# Patient Record
Sex: Male | Born: 1955
Health system: Southern US, Community
[De-identification: ages and names within clinical notes are randomized; demographics above are authoritative.]

## PROBLEM LIST (undated history)

## (undated) DIAGNOSIS — E119 Type 2 diabetes mellitus without complications: Secondary | ICD-10-CM

## (undated) DIAGNOSIS — E785 Hyperlipidemia, unspecified: Secondary | ICD-10-CM

## (undated) DIAGNOSIS — K420 Umbilical hernia with obstruction, without gangrene: Secondary | ICD-10-CM

## (undated) DIAGNOSIS — K219 Gastro-esophageal reflux disease without esophagitis: Secondary | ICD-10-CM

## (undated) DIAGNOSIS — Z973 Presence of spectacles and contact lenses: Secondary | ICD-10-CM

## (undated) DIAGNOSIS — I10 Essential (primary) hypertension: Secondary | ICD-10-CM

## (undated) DIAGNOSIS — Z86010 Personal history of colonic polyps: Secondary | ICD-10-CM

## (undated) DIAGNOSIS — C61 Malignant neoplasm of prostate: Secondary | ICD-10-CM

## (undated) DIAGNOSIS — R351 Nocturia: Secondary | ICD-10-CM

## (undated) DIAGNOSIS — Z860101 Personal history of adenomatous and serrated colon polyps: Secondary | ICD-10-CM

## (undated) DIAGNOSIS — I517 Cardiomegaly: Secondary | ICD-10-CM

## (undated) DIAGNOSIS — R972 Elevated prostate specific antigen [PSA]: Secondary | ICD-10-CM

## (undated) HISTORY — PX: COLONOSCOPY: SHX174

## (undated) HISTORY — PX: POLYPECTOMY: SHX149

## (undated) HISTORY — DX: Hyperlipidemia, unspecified: E78.5

## (undated) HISTORY — PX: FINGER SURGERY: SHX640

## (undated) HISTORY — DX: Cardiomegaly: I51.7

## (undated) HISTORY — DX: Essential (primary) hypertension: I10

## (undated) HISTORY — PX: WISDOM TOOTH EXTRACTION: SHX21

## (undated) HISTORY — PX: OTHER SURGICAL HISTORY: SHX169

## (undated) HISTORY — DX: Gastro-esophageal reflux disease without esophagitis: K21.9

---
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
## 2004-07-19 ENCOUNTER — Ambulatory Visit: Payer: Self-pay | Admitting: Internal Medicine

## 2004-08-02 ENCOUNTER — Ambulatory Visit: Payer: Self-pay | Admitting: Internal Medicine

## 2010-12-15 ENCOUNTER — Encounter (INDEPENDENT_AMBULATORY_CARE_PROVIDER_SITE_OTHER): Payer: Self-pay | Admitting: General Surgery

## 2010-12-20 ENCOUNTER — Ambulatory Visit (INDEPENDENT_AMBULATORY_CARE_PROVIDER_SITE_OTHER): Payer: 59 | Admitting: General Surgery

## 2010-12-20 ENCOUNTER — Encounter (INDEPENDENT_AMBULATORY_CARE_PROVIDER_SITE_OTHER): Payer: Self-pay | Admitting: General Surgery

## 2010-12-20 VITALS — BP 138/94 | HR 64 | Temp 98.4°F | Ht 70.5 in | Wt 175.2 lb

## 2010-12-20 DIAGNOSIS — K436 Other and unspecified ventral hernia with obstruction, without gangrene: Secondary | ICD-10-CM

## 2010-12-20 NOTE — Patient Instructions (Signed)
You have a hernia in the middle of your abdominal wall. I cannot completely push everything back in. We are going to schedule you for a laparoscopic repair of your ventral hernia with mesh.

## 2010-12-20 NOTE — Progress Notes (Signed)
Chief Complaint  Patient presents with  . Other    new pt -eval of umbilical hernia    HPI Dwayne Mcgrath. is a 55 y.o. male.    This is a 54 year old AfricanAmerican man, referred to me through the courtesy of Dr. Gerlene Burdock Tisovic for evaluation of a partially incarcerated ventral hernia.  The patient does not have very many symptoms. He says he's noticed a bulge for 2 years. He says it's  been getting larger. He doesn't have much pain. No nausea, no vomiting, no change in his bowel habits. He has never had a hernia before. operation performed.  Significant comorbidities include diabetes mellitus type 2, reflux esophagitis, left ventricular hypertrophy, gout, and borderline hypertension.HPI  Past Medical History  Diagnosis Date  . Diabetes mellitus   . GERD (gastroesophageal reflux disease)   . Hyperlipidemia   . Gout   . LVH (left ventricular hypertrophy)   . Hypertension     Past Surgical History  Procedure Date  . Thumb surgery   . Wisdom tooth extraction     Family History  Problem Relation Age of Onset  . Diabetes Mother     Social History History  Substance Use Topics  . Smoking status: Never Smoker   . Smokeless tobacco: Not on file  . Alcohol Use: 0.0 oz/week    Not on File  Current Outpatient Prescriptions  Medication Sig Dispense Refill  . allopurinol (ZYLOPRIM) 300 MG tablet Daily.      . benazepril-hydrochlorthiazide (LOTENSIN HCT) 10-12.5 MG per tablet Daily.      . Ezetimibe-Simvastatin (VYTORIN PO) Take by mouth.        . metFORMIN (GLUCOPHAGE) 1000 MG tablet Daily.      . Nutritional Supplements (EQUATE PO) Take by mouth daily.          Review of Systems Review of Systems  Constitutional: Negative.   HENT: Negative.   Eyes: Negative.   Respiratory: Negative.   Cardiovascular: Negative.   Gastrointestinal: Positive for heartburn and abdominal pain. Negative for nausea, vomiting, diarrhea, constipation, blood in stool and melena.    Genitourinary: Negative.   Musculoskeletal: Negative.   Skin: Negative.   Neurological: Negative.   Endo/Heme/Allergies: Negative.   Psychiatric/Behavioral: Negative.     Blood pressure 138/94, pulse 64, temperature 98.4 F (36.9 C), height 5' 10.5" (1.791 m), weight 175 lb 4 oz (79.493 kg).  Physical Exam Physical Exam  Constitutional: He is oriented to person, place, and time. He appears well-developed and well-nourished. No distress.  HENT:  Head: Normocephalic and atraumatic.  Mouth/Throat: No oropharyngeal exudate.  Eyes: Conjunctivae are normal. Pupils are equal, round, and reactive to light. Scleral icterus is present.  Neck: Normal range of motion. Neck supple. No JVD present. No tracheal deviation present. No thyromegaly present.  Cardiovascular: Normal rate and regular rhythm.  Exam reveals no gallop.   No murmur heard. Respiratory: Effort normal and breath sounds normal. No respiratory distress. He has no wheezes. He has no rales. He exhibits no tenderness.  GI: Soft. He exhibits mass. He exhibits no distension. There is no tenderness. There is no rebound and no guarding.    Genitourinary:       No inguinal or femoral hernias.  Musculoskeletal: Normal range of motion. He exhibits no edema and no tenderness.  Lymphadenopathy:    He has no cervical adenopathy.  Neurological: He is alert and oriented to person, place, and time. He exhibits abnormal muscle tone.  Skin: Skin is warm and  dry. No rash noted. No erythema. No pallor.  Psychiatric: He has a normal mood and affect. His behavior is normal. Judgment and thought content normal.     Data Reviewed i have reviewed office notes from Dr. Neale Burly.  Assessment    Incarcerated ventral hernia. This is somewhat above the umbilicus but extends down to the umbilicus. It may be a combination of umbilical and epigastric hernias. I cannot completely reduce this but there is no evidence of acute obstruction or  strangulation.  Non-insulin-dependent diabetes mellitus.  Gastroesophageal reflux disease, status post upper endoscopy by lower GI.  Hyperlipidemia  Left ventricular hypertrophy.  gout  Mild hypertension.    Plan    Scheduled for elective laparoscopic repair of his ventral hernia with mesh.  I told him that this surgery might need to be converted to open it we cannot reduce the incarceration.  He decided  to go ahead with the surgery in the near future. I discussed indications and details of surgery with him. Risks and complications have been outlined, including but not limited to bleeding, infection, conversion to open hernia repair, recurrence of the hernia, injury to adjacent organs as the intestine or bladder with major reconstructive surgery, cardiac pulmonary and thromboembolic problems. He seems to understand these issues well. He understands these issues well.[name] He is in full agreement with this plan.       [name] M 12/20/2010, 2:13 PM

## 2010-12-29 ENCOUNTER — Encounter: Payer: Self-pay | Admitting: General Surgery

## 2011-06-01 ENCOUNTER — Encounter: Payer: Self-pay | Admitting: Internal Medicine

## 2012-05-30 ENCOUNTER — Encounter: Payer: Self-pay | Admitting: Internal Medicine

## 2012-05-31 ENCOUNTER — Emergency Department (HOSPITAL_COMMUNITY): Payer: Self-pay

## 2012-05-31 ENCOUNTER — Encounter (HOSPITAL_COMMUNITY): Payer: Self-pay | Admitting: Emergency Medicine

## 2012-05-31 ENCOUNTER — Emergency Department (HOSPITAL_COMMUNITY)
Admission: EM | Admit: 2012-05-31 | Discharge: 2012-05-31 | Disposition: A | Discharge status: Home / Self Care | Payer: Self-pay | Attending: Emergency Medicine | Admitting: Emergency Medicine

## 2012-05-31 DIAGNOSIS — S9001XA Contusion of right ankle, initial encounter: Secondary | ICD-10-CM

## 2012-05-31 DIAGNOSIS — E785 Hyperlipidemia, unspecified: Secondary | ICD-10-CM | POA: Insufficient documentation

## 2012-05-31 DIAGNOSIS — Y92410 Unspecified street and highway as the place of occurrence of the external cause: Secondary | ICD-10-CM | POA: Insufficient documentation

## 2012-05-31 DIAGNOSIS — S9000XA Contusion of unspecified ankle, initial encounter: Secondary | ICD-10-CM | POA: Insufficient documentation

## 2012-05-31 DIAGNOSIS — Z8719 Personal history of other diseases of the digestive system: Secondary | ICD-10-CM | POA: Insufficient documentation

## 2012-05-31 DIAGNOSIS — Y939 Activity, unspecified: Secondary | ICD-10-CM | POA: Insufficient documentation

## 2012-05-31 DIAGNOSIS — E119 Type 2 diabetes mellitus without complications: Secondary | ICD-10-CM | POA: Insufficient documentation

## 2012-05-31 DIAGNOSIS — Z8679 Personal history of other diseases of the circulatory system: Secondary | ICD-10-CM | POA: Insufficient documentation

## 2012-05-31 DIAGNOSIS — Z23 Encounter for immunization: Secondary | ICD-10-CM | POA: Insufficient documentation

## 2012-05-31 DIAGNOSIS — I10 Essential (primary) hypertension: Secondary | ICD-10-CM | POA: Insufficient documentation

## 2012-05-31 DIAGNOSIS — Z862 Personal history of diseases of the blood and blood-forming organs and certain disorders involving the immune mechanism: Secondary | ICD-10-CM | POA: Insufficient documentation

## 2012-05-31 DIAGNOSIS — Z79899 Other long term (current) drug therapy: Secondary | ICD-10-CM | POA: Insufficient documentation

## 2012-05-31 DIAGNOSIS — Z8639 Personal history of other endocrine, nutritional and metabolic disease: Secondary | ICD-10-CM | POA: Insufficient documentation

## 2012-05-31 MED ORDER — ONDANSETRON 4 MG PO TBDP
4.0000 mg | ORAL_TABLET | Freq: Once | ORAL | Status: AC
  Administered 2012-05-31: 4 mg via ORAL
  Filled 2012-05-31: qty 1

## 2012-05-31 MED ORDER — HYDROCODONE-ACETAMINOPHEN 5-325 MG PO TABS
2.0000 | ORAL_TABLET | Freq: Once | ORAL | Status: AC
  Administered 2012-05-31: 2 via ORAL
  Filled 2012-05-31: qty 2

## 2012-05-31 MED ORDER — IBUPROFEN 400 MG PO TABS
800.0000 mg | ORAL_TABLET | Freq: Once | ORAL | Status: AC
  Administered 2012-05-31: 800 mg via ORAL
  Filled 2012-05-31: qty 2

## 2012-05-31 MED ORDER — HYDROCODONE-ACETAMINOPHEN 5-325 MG PO TABS
ORAL_TABLET | ORAL | Status: DC
Start: 2012-05-31 — End: 2013-02-20

## 2012-05-31 MED ORDER — TETANUS-DIPHTH-ACELL PERTUSSIS 5-2.5-18.5 LF-MCG/0.5 IM SUSP
0.5000 mL | Freq: Once | INTRAMUSCULAR | Status: AC
  Administered 2012-05-31: 0.5 mL via INTRAMUSCULAR
  Filled 2012-05-31: qty 0.5

## 2012-05-31 NOTE — ED Notes (Signed)
Ortho Tech in. 

## 2012-05-31 NOTE — ED Notes (Signed)
Also c/o left hip pain.

## 2012-05-31 NOTE — ED Notes (Signed)
Returned from xray

## 2012-05-31 NOTE — Progress Notes (Signed)
Orthopedic Tech Progress Note Patient Details:  Dwayne Mcgrath. 10-28-1955 [id number] Ace wrap applied to Right LE to protect dressing from ankle brace. Ankle ASO brace applied to Right LE with care instructions. Application tolerated well. Crutches fitted to patient's height and comfort. Patient demonstrated proper crutch use.  Ortho Devices Type of Ortho Device: ASO;Crutches;Ace wrap Ortho Device/Splint Location: Right Ortho Device/Splint Interventions: Application   [name] 05/31/2012, 1:22 PM
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
## 2012-05-31 NOTE — ED Notes (Signed)
Patient transported to X-ray 

## 2012-06-04 NOTE — ED Provider Notes (Signed)
Medical screening examination/treatment/procedure(s) were performed by non-physician practitioner and as supervising physician I was immediately available for consultation/collaboration.   Suzi Roots, MD 06/04/12 [phone]

## 2013-02-20 ENCOUNTER — Ambulatory Visit (INDEPENDENT_AMBULATORY_CARE_PROVIDER_SITE_OTHER): Payer: No Typology Code available for payment source | Admitting: Family Medicine

## 2013-02-20 ENCOUNTER — Encounter (HOSPITAL_COMMUNITY): Admission: EM | Disposition: A | Discharge status: Home / Self Care | Payer: Self-pay | Source: Home / Self Care

## 2013-02-20 ENCOUNTER — Encounter (HOSPITAL_COMMUNITY): Payer: No Typology Code available for payment source | Admitting: Anesthesiology

## 2013-02-20 ENCOUNTER — Inpatient Hospital Stay (HOSPITAL_COMMUNITY)
Admission: EM | Admit: 2013-02-20 | Discharge: 2013-02-25 | DRG: 354 | Disposition: A | Discharge status: Home / Self Care | Payer: No Typology Code available for payment source | Attending: Surgery | Admitting: Surgery

## 2013-02-20 ENCOUNTER — Ambulatory Visit (HOSPITAL_COMMUNITY)
Admission: RE | Admit: 2013-02-20 | Discharge: 2013-02-20 | Disposition: A | Discharge status: Home / Self Care | Payer: No Typology Code available for payment source | Source: Ambulatory Visit | Attending: Family Medicine | Admitting: Family Medicine

## 2013-02-20 ENCOUNTER — Encounter (HOSPITAL_COMMUNITY): Payer: Self-pay | Admitting: Emergency Medicine

## 2013-02-20 ENCOUNTER — Ambulatory Visit: Payer: No Typology Code available for payment source

## 2013-02-20 ENCOUNTER — Emergency Department (HOSPITAL_COMMUNITY): Payer: No Typology Code available for payment source | Admitting: Anesthesiology

## 2013-02-20 VITALS — BP 128/74 | HR 96 | Temp 98.9°F | Resp 16 | Ht 70.5 in | Wt 168.8 lb

## 2013-02-20 DIAGNOSIS — Z79899 Other long term (current) drug therapy: Secondary | ICD-10-CM

## 2013-02-20 DIAGNOSIS — N281 Cyst of kidney, acquired: Secondary | ICD-10-CM | POA: Insufficient documentation

## 2013-02-20 DIAGNOSIS — M47817 Spondylosis without myelopathy or radiculopathy, lumbosacral region: Secondary | ICD-10-CM | POA: Insufficient documentation

## 2013-02-20 DIAGNOSIS — E119 Type 2 diabetes mellitus without complications: Secondary | ICD-10-CM

## 2013-02-20 DIAGNOSIS — R109 Unspecified abdominal pain: Secondary | ICD-10-CM

## 2013-02-20 DIAGNOSIS — Z833 Family history of diabetes mellitus: Secondary | ICD-10-CM

## 2013-02-20 DIAGNOSIS — E785 Hyperlipidemia, unspecified: Secondary | ICD-10-CM | POA: Diagnosis present

## 2013-02-20 DIAGNOSIS — E871 Hypo-osmolality and hyponatremia: Secondary | ICD-10-CM | POA: Diagnosis present

## 2013-02-20 DIAGNOSIS — K56609 Unspecified intestinal obstruction, unspecified as to partial versus complete obstruction: Secondary | ICD-10-CM

## 2013-02-20 DIAGNOSIS — K420 Umbilical hernia with obstruction, without gangrene: Secondary | ICD-10-CM | POA: Insufficient documentation

## 2013-02-20 DIAGNOSIS — K219 Gastro-esophageal reflux disease without esophagitis: Secondary | ICD-10-CM | POA: Diagnosis present

## 2013-02-20 DIAGNOSIS — R188 Other ascites: Secondary | ICD-10-CM | POA: Insufficient documentation

## 2013-02-20 DIAGNOSIS — I10 Essential (primary) hypertension: Secondary | ICD-10-CM | POA: Diagnosis present

## 2013-02-20 DIAGNOSIS — K7689 Other specified diseases of liver: Secondary | ICD-10-CM | POA: Insufficient documentation

## 2013-02-20 DIAGNOSIS — M109 Gout, unspecified: Secondary | ICD-10-CM | POA: Diagnosis present

## 2013-02-20 HISTORY — PX: UMBILICAL HERNIA REPAIR: SHX196

## 2013-02-20 HISTORY — DX: Umbilical hernia with obstruction, without gangrene: K42.0

## 2013-02-20 LAB — POCT URINALYSIS DIPSTICK
Blood, UA: NEGATIVE
Glucose, UA: NEGATIVE
Ketones, UA: 40
Spec Grav, UA: >=1.03

## 2013-02-20 LAB — GLUCOSE, POCT (MANUAL RESULT ENTRY): POC Glucose: 144 mg/dl — AB (ref 70–99)

## 2013-02-20 LAB — POCT UA - MICROSCOPIC ONLY
Bacteria, U Microscopic: NEGATIVE
Crystals, Ur, HPF, POC: NEGATIVE
Mucus, UA: POSITIVE
RBC, urine, microscopic: NEGATIVE

## 2013-02-20 LAB — BASIC METABOLIC PANEL
CO2: 25 mEq/L (ref 19–32)
Calcium: 9.6 mg/dL (ref 8.4–10.5)
Creatinine, Ser: 1.05 mg/dL (ref 0.50–1.35)

## 2013-02-20 LAB — POCT CBC
HCT, POC: 46.1 % (ref 43.5–53.7)
Hemoglobin: 14.8 g/dL (ref 14.1–18.1)
Lymph, poc: 1.4 (ref 0.6–3.4)
MCHC: 32.1 g/dL (ref 31.8–35.4)
MCV: 91.1 fL (ref 80–97)
POC Granulocyte: 8.4 — AB (ref 2–6.9)
POC LYMPH PERCENT: 13.9 %L (ref 10–50)

## 2013-02-20 LAB — CBC WITH DIFFERENTIAL/PLATELET
Basophils Absolute: 0 10*3/uL (ref 0.0–0.1)
Eosinophils Relative: 1 % (ref 0–5)
HCT: 44.6 % (ref 39.0–52.0)
Lymphocytes Relative: 22 % (ref 12–46)
MCH: 29.6 pg (ref 26.0–34.0)
MCHC: 35.7 g/dL (ref 30.0–36.0)
MCV: 83.1 fL (ref 78.0–100.0)
Monocytes Absolute: 0.7 10*3/uL (ref 0.1–1.0)
RDW: 13.4 % (ref 11.5–15.5)
WBC: 9.8 10*3/uL (ref 4.0–10.5)

## 2013-02-20 LAB — COMPREHENSIVE METABOLIC PANEL
Albumin: 5.1 g/dL (ref 3.5–5.2)
CO2: 27 mEq/L (ref 19–32)
Calcium: 10.7 mg/dL — ABNORMAL HIGH (ref 8.4–10.5)
Chloride: 96 mEq/L (ref 96–112)
Glucose, Bld: 147 mg/dL — ABNORMAL HIGH (ref 70–99)
Potassium: 4.3 mEq/L (ref 3.5–5.3)
Sodium: 140 mEq/L (ref 135–145)
Total Protein: 8.3 g/dL (ref 6.0–8.3)

## 2013-02-20 LAB — POCT SEDIMENTATION RATE: POCT SED RATE: 20 mm/hr (ref 0–22)

## 2013-02-20 LAB — LACTIC ACID, PLASMA: Lactic Acid, Venous: 1.2 mmol/L (ref 0.5–2.2)

## 2013-02-20 LAB — IFOBT (OCCULT BLOOD): IFOBT: NEGATIVE

## 2013-02-20 SURGERY — REPAIR, HERNIA, UMBILICAL, ADULT
Anesthesia: General | Site: Abdomen | Wound class: Clean

## 2013-02-20 MED ORDER — ONDANSETRON HCL 4 MG/2ML IJ SOLN
4.0000 mg | Freq: Once | INTRAMUSCULAR | Status: AC
  Administered 2013-02-20: 4 mg via INTRAVENOUS
  Filled 2013-02-20: qty 2

## 2013-02-20 MED ORDER — FENTANYL CITRATE 0.05 MG/ML IJ SOLN
INTRAMUSCULAR | Status: DC | PRN
  Administered 2013-02-20 (×2): 100 ug via INTRAVENOUS
  Administered 2013-02-21: 50 ug via INTRAVENOUS

## 2013-02-20 MED ORDER — IOHEXOL 300 MG/ML  SOLN
100.0000 mL | Freq: Once | INTRAMUSCULAR | Status: AC | PRN
  Administered 2013-02-20: 100 mL via INTRAVENOUS

## 2013-02-20 MED ORDER — IOHEXOL 300 MG/ML  SOLN
50.0000 mL | Freq: Once | INTRAMUSCULAR | Status: AC | PRN
  Administered 2013-02-20: 50 mL via ORAL

## 2013-02-20 MED ORDER — CEFOXITIN SODIUM 1 G IV SOLR
INTRAVENOUS | Status: AC
  Filled 2013-02-20 (×2): qty 1

## 2013-02-20 MED ORDER — FENTANYL CITRATE 0.05 MG/ML IJ SOLN
100.0000 ug | Freq: Once | INTRAMUSCULAR | Status: AC
  Administered 2013-02-20: 100 ug via INTRAVENOUS
  Filled 2013-02-20: qty 2

## 2013-02-20 SURGICAL SUPPLY — 44 items
ADH SKN CLS APL DERMABOND .7 (GAUZE/BANDAGES/DRESSINGS)
APL SKNCLS STERI-STRIP NONHPOA (GAUZE/BANDAGES/DRESSINGS)
BENZOIN TINCTURE PRP APPL 2/3 (GAUZE/BANDAGES/DRESSINGS) IMPLANT
BLADE HEX COATED 2.75 (ELECTRODE) ×2 IMPLANT
BLADE SURG SZ10 CARB STEEL (BLADE) ×2 IMPLANT
CLOTH BEACON ORANGE TIMEOUT ST (SAFETY) ×2 IMPLANT
DECANTER SPIKE VIAL GLASS SM (MISCELLANEOUS) ×2 IMPLANT
DERMABOND ADVANCED (GAUZE/BANDAGES/DRESSINGS)
DERMABOND ADVANCED .7 DNX12 (GAUZE/BANDAGES/DRESSINGS) IMPLANT
DRAPE LAPAROTOMY T 102X78X121 (DRAPES) ×2 IMPLANT
ELECT REM PT RETURN 9FT ADLT (ELECTROSURGICAL) ×2
ELECTRODE REM PT RTRN 9FT ADLT (ELECTROSURGICAL) ×1 IMPLANT
GLOVE BIOGEL PI IND STRL 7.0 (GLOVE) ×1 IMPLANT
GLOVE BIOGEL PI INDICATOR 7.0 (GLOVE) ×1
GOWN PREVENTION PLUS LG XLONG (DISPOSABLE) ×2 IMPLANT
GOWN STRL REIN XL XLG (GOWN DISPOSABLE) ×4 IMPLANT
KIT BASIN OR (CUSTOM PROCEDURE TRAY) ×2 IMPLANT
MARKER SKIN DUAL TIP RULER LAB (MISCELLANEOUS) IMPLANT
NDL HYPO 25X1 1.5 SAFETY (NEEDLE) IMPLANT
NEEDLE HYPO 22GX1.5 SAFETY (NEEDLE) ×2 IMPLANT
NEEDLE HYPO 25X1 1.5 SAFETY (NEEDLE) IMPLANT
NS IRRIG 1000ML POUR BTL (IV SOLUTION) ×2 IMPLANT
PACK BASIC VI WITH GOWN DISP (CUSTOM PROCEDURE TRAY) ×2 IMPLANT
PENCIL BUTTON HOLSTER BLD 10FT (ELECTRODE) ×2 IMPLANT
SPONGE GAUZE 4X4 12PLY (GAUZE/BANDAGES/DRESSINGS) ×1 IMPLANT
SPONGE LAP 18X18 X RAY DECT (DISPOSABLE) ×1 IMPLANT
SPONGE LAP 4X18 X RAY DECT (DISPOSABLE) ×2 IMPLANT
STRIP CLOSURE SKIN 1/2X4 (GAUZE/BANDAGES/DRESSINGS) IMPLANT
SUT MNCRL AB 4-0 PS2 18 (SUTURE) ×2 IMPLANT
SUT NOVA 1 T20/GS 25DT (SUTURE) ×1 IMPLANT
SUT PROLENE 0 CT 1 30 (SUTURE) IMPLANT
SUT PROLENE 0 CT 1 CR/8 (SUTURE) IMPLANT
SUT PROLENE 0 CT 2 (SUTURE) IMPLANT
SUT SILK 2 0 (SUTURE) ×2
SUT SILK 2-0 18XBRD TIE 12 (SUTURE) IMPLANT
SUT SILK 3 0 (SUTURE)
SUT SILK 3 0 SH CR/8 (SUTURE) ×2 IMPLANT
SUT SILK 3-0 18XBRD TIE 12 (SUTURE) IMPLANT
SUT VIC AB 3-0 SH 27 (SUTURE)
SUT VIC AB 3-0 SH 27XBRD (SUTURE) IMPLANT
SYR CONTROL 10ML LL (SYRINGE) ×2 IMPLANT
TAPE CLOTH SURG 4X10 WHT LF (GAUZE/BANDAGES/DRESSINGS) ×1 IMPLANT
TOWEL OR 17X26 10 PK STRL BLUE (TOWEL DISPOSABLE) ×2 IMPLANT
TRAY FOLEY CATH 14FRSI W/METER (CATHETERS) ×1 IMPLANT

## 2013-02-20 NOTE — Anesthesia Preprocedure Evaluation (Signed)
Anesthesia Evaluation  Patient identified by MRN, date of birth, ID band Patient awake    Reviewed: Allergy & Precautions, H&P , NPO status , Patient's Chart, lab work & pertinent test results  Airway Mallampati: II TM Distance: >3 FB Neck ROM: full    Dental no notable dental hx. (+) Teeth Intact and Dental Advisory Given   Pulmonary neg pulmonary ROS,  breath sounds clear to auscultation  Pulmonary exam normal       Cardiovascular Exercise Tolerance: Good hypertension, Pt. on medications Rhythm:regular Rate:Normal  LVH   Neuro/Psych negative neurological ROS  negative psych ROS   GI/Hepatic negative GI ROS, Neg liver ROS,   Endo/Other  diabetes, Well Controlled, Type 2, Oral Hypoglycemic Agents  Renal/GU negative Renal ROS  negative genitourinary   Musculoskeletal   Abdominal   Peds  Hematology negative hematology ROS (+)   Anesthesia Other Findings hyponatremia  Reproductive/Obstetrics negative OB ROS                           Anesthesia Physical Anesthesia Plan  ASA: III and emergent  Anesthesia Plan: General   Post-op Pain Management:    Induction: Intravenous, Rapid sequence and Cricoid pressure planned  Airway Management Planned: Oral ETT  Additional Equipment:   Intra-op Plan:   Post-operative Plan: Extubation in OR  Informed Consent: I have reviewed the patients History and Physical, chart, labs and discussed the procedure including the risks, benefits and alternatives for the proposed anesthesia with the patient or authorized representative who has indicated his/her understanding and acceptance.   Dental Advisory Given  Plan Discussed with: CRNA and Surgeon  Anesthesia Plan Comments:         Anesthesia Quick Evaluation

## 2013-02-20 NOTE — ED Provider Notes (Signed)
CSN: [id number]     Arrival date & time 02/20/13  2043 History   First MD Initiated Contact with Patient 02/20/13 2114     Chief Complaint  Patient presents with  . Abdominal Pain    Patient is a 57 y.o. male presenting with abdominal pain.  Abdominal Pain  Patient presents with 2 weeks of on again off again abdominal pain which is worsened associated with decreased bowel movements and nausea vomiting.  Patient had a CT scan of the abdomen today which showed a incarcerated umbilical hernia associated with small bowel obstruction.  Patient has previous medical history of diabetes and hypertension.  Has an appointment to see a general surgeon next Tuesday. Past Medical History  Diagnosis Date  . Diabetes mellitus   . GERD (gastroesophageal reflux disease)   . Hyperlipidemia   . Gout   . LVH (left ventricular hypertrophy)   . Hypertension    Past Surgical History  Procedure Laterality Date  . Thumb surgery    . Wisdom tooth extraction    . Umbilical hernia repair N/A 02/20/2013    Procedure: Incarcerated Umbilical Hernia Repair ;  Surgeon: Mariella Saa, MD;  Location: [facility];  Service: General;  Laterality: N/A;   Family History  Problem Relation Age of Onset  . Diabetes Mother    History  Substance Use Topics  . Smoking status: Never Smoker   . Smokeless tobacco: Not on file  . Alcohol Use: 0.0 oz/week    Review of Systems  Gastrointestinal: Positive for abdominal pain.  All other systems reviewed and are negative.    Allergies  Review of patient's allergies indicates no known allergies.  Home Medications   No current outpatient prescriptions on file. BP 156/80  Pulse 76  Temp(Src) 98 F (36.7 C) (Oral)  Resp 22  Ht 5' 10.8" (1.798 m)  Wt 173 lb 11.6 oz (78.8 kg)  BMI 24.38 kg/m2  SpO2 97% Physical Exam  Nursing note and vitals reviewed. Constitutional: He is oriented to person, place, and time. He appears well-developed and well-nourished. No  distress.  HENT:  Head: Normocephalic and atraumatic.  Eyes: Pupils are equal, round, and reactive to light.  Neck: Normal range of motion.  Cardiovascular: Normal rate and intact distal pulses.   Pulmonary/Chest: No respiratory distress.  Abdominal: Normal appearance. He exhibits mass (Periumbilical hernia which is tender to palpation). He exhibits no distension.  Musculoskeletal: Normal range of motion.  Neurological: He is alert and oriented to person, place, and time. No cranial nerve deficit.  Skin: Skin is warm and dry. No rash noted.  Psychiatric: He has a normal mood and affect. His behavior is normal.    ED Course  Procedures (including critical care time) Labs Review Labs Reviewed  BASIC METABOLIC PANEL - Abnormal; Notable for the following:    Sodium 130 (*)    Chloride 92 (*)    Glucose, Bld 141 (*)    GFR calc non Af Amer 76 (*)    GFR calc Af Amer 88 (*)    All other components within normal limits  GLUCOSE, CAPILLARY - Abnormal; Notable for the following:    Glucose-Capillary 145 (*)    All other components within normal limits  BASIC METABOLIC PANEL - Abnormal; Notable for the following:    Sodium 133 (*)    Potassium 5.3 (*)    Glucose, Bld 188 (*)    GFR calc non Af Amer 88 (*)    All other components within  normal limits  CBC - Abnormal; Notable for the following:    HCT 37.9 (*)    All other components within normal limits  GLUCOSE, CAPILLARY - Abnormal; Notable for the following:    Glucose-Capillary 119 (*)    All other components within normal limits  GLUCOSE, CAPILLARY - Abnormal; Notable for the following:    Glucose-Capillary 132 (*)    All other components within normal limits  GLUCOSE, CAPILLARY - Abnormal; Notable for the following:    Glucose-Capillary 100 (*)    All other components within normal limits  GLUCOSE, CAPILLARY - Abnormal; Notable for the following:    Glucose-Capillary 148 (*)    All other components within normal limits   GLUCOSE, CAPILLARY - Abnormal; Notable for the following:    Glucose-Capillary 127 (*)    All other components within normal limits  GLUCOSE, CAPILLARY - Abnormal; Notable for the following:    Glucose-Capillary 103 (*)    All other components within normal limits  GLUCOSE, CAPILLARY - Abnormal; Notable for the following:    Glucose-Capillary 110 (*)    All other components within normal limits  CBC WITH DIFFERENTIAL  LACTIC ACID, PLASMA  GLUCOSE, CAPILLARY  GLUCOSE, CAPILLARY  GLUCOSE, CAPILLARY  GLUCOSE, CAPILLARY  SURGICAL PATHOLOGY   Imaging Review Ct Abdomen Pelvis W Contrast  02/20/2013   CLINICAL DATA:  Abdominal pain. History of umbilical hernia. Small bowel obstruction.  EXAM: CT ABDOMEN AND PELVIS WITH CONTRAST  TECHNIQUE: Multidetector CT imaging of the abdomen and pelvis was performed using the standard protocol following bolus administration of intravenous contrast.  CONTRAST:  OMNIPAQUE IOHEXOL 300 MG/ML SOLN, 50mL OMNIPAQUE IOHEXOL 300 MG/ML SOLN  COMPARISON:  Radiographs 02/20/2013.  FINDINGS: Lung Bases: Clear.  Liver: Lobulated cystic lesion is present in the left hepatic lobe measuring 25 mm (image 16 series 2). This likely represents a cyst. This could represent a cavernous hemangioma and the appears to be a tiny focus of peripheral enhancement. Regardless, the lesion is likely benign.  Spleen:  Normal.  Gallbladder:  Normal.  Common bile duct:  Normal.  Pancreas:  Normal.  Adrenal glands:  Normal.  Kidneys: Left upper pole simple renal cyst. Tiny subcentimeter right renal lesions probably also represent benign cysts. The ureters appear within normal limits. Normal enhancement and delayed excretion of contrast.  Stomach:  Normal, partially collapsed.  Small bowel: Small bowel obstruction is present secondary to a knuckle of small bowel all which is probably incarcerated in a periumbilical hernia. Large amount of fluid is present in the hernia sac. There is no  perforation.  Colon: The cecum is pulled anteriorly and medially by a bowel all in the hernia sac. The appendix is not identified.  Pelvic Genitourinary: Urinary bladder appears normal. Small amount of simple free fluid is present in the anatomic pelvis.  Bones: No aggressive osseous lesions or fractures. L3-L4 are spondylosis.  Vasculature: Normal.  Body Wall: Periumbilical hernia measures about 4 cm in width. No other hernia identified.  IMPRESSION: 1. Periumbilical hernia containing a likely incarcerated loop of small bowel with complete small bowel obstruction. The herniated bowel loop has pulled the cecum anteriorly. 2. Probable hepatic cyst or hemangioma. No further evaluation recommended in a patient without a history of malignancy. 3. Left renal cysts. 4. Small amount of reactive ascites.   Electronically Signed   By: Andreas Newport M.D.   On: 02/20/2013 20:25   Dg Abd 2 Views  02/20/2013   CLINICAL DATA:  Abdominal pain.  EXAM: ABDOMEN - 2 VIEW  COMPARISON:  None.  FINDINGS: Multiple air-fluid levels are seen involving the small bowel concerning for ileus or distal small bowel obstruction. No significant colonic dilatation is noted. Air is noted in the rectum. No abnormal calcifications are noted.  IMPRESSION: Multiple air-fluid levels are seen involving the small bowel suggesting ileus or distal small bowel obstruction.   Electronically Signed   By: Roque Lias M.D.   On: 02/20/2013 20:32      MDM  Discussed with general surgery([facility]) who came to ED and admitted patient for incarcerated hernia.   1. Incarcerated umbilical hernia        Nelia Shi, MD 02/24/13 1139

## 2013-02-20 NOTE — Progress Notes (Addendum)
This chart was scribed for Dwayne Sorenson, MD by Dwayne Mcgrath, ED Scribe. This patient was seen in room Room 12 and the patient's care was started at 4:21pm Subjective:    Patient ID: Dwayne Mcgrath, male    DOB: 1955-06-29, 57 y.o.   MRN: [id number]  Chief Complaint  Patient presents with  . Abdominal Pain    x 3-4 weeks   . Nausea  . Emesis   HPI Dwayne Mcgrath is a 57 y.o. male with hx of GERD and DM who presents to the [facility] complaining of ongoing worsening abd pain, nausea, and emesis onset 2 weeks. Pt states he has been having this problem for a series of years (19 years) but lately it has been different. He states his abd pain is sharp and located all through his abd region. He states when the pain comes on, it generally last 8-12 hours. He states his pain has been so bad that he had to call out of work today. Pt states within the past 2 weeks, he has been slightly constipated. He states he has taken Metamucil without relief. Pt denies having episodes of diarrhea. He states he can sense the emesis coming up before it comes up. He states his urine output has been normal. Pt states his appetite has been normal but he has been cautious due to concern of how food may affect him. Pt states he generally feels better when he has a bowel movement. Pt denies hematochezia and hematemesis.   When pt turned 38, he has had 3 endoscopies with his GI doctor.  Pt states his hernia has grown and states he is needing surgery. He has been told he will be needing surgery in 2017.    Past Medical History  Diagnosis Date  . Diabetes mellitus   . GERD (gastroesophageal reflux disease)   . Hyperlipidemia   . Gout   . LVH (left ventricular hypertrophy)   . Hypertension    Current Outpatient Prescriptions on File Prior to Visit  Medication Sig Dispense Refill  . allopurinol (ZYLOPRIM) 300 MG tablet Take 300 mg by mouth daily.       . benazepril-hydrochlorthiazide (LOTENSIN HCT) 10-12.5 MG per tablet  Take 1 tablet by mouth daily.       [name] ezetimibe-simvastatin (VYTORIN) 10-40 MG per tablet Take 1 tablet by mouth daily.      . metFORMIN (GLUCOPHAGE) 1000 MG tablet Take 1,000 mg by mouth daily with breakfast.       . HYDROcodone-acetaminophen (NORCO/VICODIN) 5-325 MG per tablet 1 or 2 po q4h prn pain  20 tablet  0  . ibuprofen (ADVIL,MOTRIN) 800 MG tablet Take 800 mg by mouth every 8 (eight) hours as needed. For pain       No current facility-administered medications on file prior to visit.   No Known Allergies  Review of Systems  Constitutional: Negative for fever, chills, diaphoresis, activity change and appetite change.  Respiratory: Negative for shortness of breath.   Cardiovascular: Negative for chest pain.  Gastrointestinal: Positive for nausea, vomiting (Negative for hematemesis ), abdominal pain, constipation and abdominal distention. Negative for diarrhea (Negative for hematochezia), blood in stool, anal bleeding and rectal pain.       Pt has umbilical hernia   Genitourinary: Negative for dysuria, frequency and decreased urine volume.  Hematological: Negative for adenopathy.    Triage Vitals: BP 128/74  Pulse 96  Temp(Src) 98.9 F (37.2 C) (Oral)  Resp 16  Ht 5' 10.5" (1.791 m)  Wt 168 lb 12.8 oz (76.567 kg)  BMI 23.87 kg/m2  SpO2 100%  Objective:   Physical Exam  Nursing note and vitals reviewed. Constitutional: He is oriented to person, place, and time. He appears well-developed and well-nourished. No distress.  Pts speech was pressured.  HENT:  Head: Normocephalic and atraumatic.  Right Ear: External ear normal.  Left Ear: External ear normal.  Eyes: Conjunctivae are normal. Pupils are equal, round, and reactive to light.  Neck: Normal range of motion. Neck supple. No thyromegaly present.  Cardiovascular: Normal rate and regular rhythm.   Slight tachycardia.    Pulmonary/Chest: Effort normal. He has no wheezes.  Slight ronchi.    Abdominal: Soft. Bowel  sounds are normal. He exhibits no mass. There is no rebound and no guarding.  Large 3-4 inch embilical hernia. Hernia is reduceable.   Genitourinary:  Some external hemroids. Non-inflammed. Otherwise normal.   Musculoskeletal: Normal range of motion. He exhibits no tenderness.  Neurological: He is alert and oriented to person, place, and time. He has normal reflexes.  Skin: Skin is warm and dry. He is not diaphoretic.  Psychiatric: He has a normal mood and affect. His behavior is normal.   Results for orders placed in visit on 02/20/13  POCT CBC      Result Value Range   WBC 10.4 (*) 4.6 - 10.2 K/uL   Lymph, poc 1.4  0.6 - 3.4   POC LYMPH PERCENT 13.9  10 - 50 %L   MID (cbc) 0.5  0 - 0.9   POC MID % 5.0  0 - 12 %M   POC Granulocyte 8.4 (*) 2 - 6.9   Granulocyte percent 81.1 (*) 37 - 80 %G   RBC 5.06  4.69 - 6.13 M/uL   Hemoglobin 14.8  14.1 - 18.1 g/dL   HCT, POC 23.5  57.3 - 53.7 %   MCV 91.1  80 - 97 fL   MCH, POC 29.2  27 - 31.2 pg   MCHC 32.1  31.8 - 35.4 g/dL   RDW, POC 22.0     Platelet Count, POC 243  142 - 424 K/uL   MPV 9.2  0 - 99.8 fL  GLUCOSE, POCT (MANUAL RESULT ENTRY)      Result Value Range   POC Glucose 144 (*) 70 - 99 mg/dl  POCT UA - MICROSCOPIC ONLY      Result Value Range   WBC, Ur, HPF, POC 0-1     RBC, urine, microscopic neg     Bacteria, U Microscopic neg     Mucus, UA positive     Epithelial cells, urine per micros 0-3     Crystals, Ur, HPF, POC neg     Casts, Ur, LPF, POC neg     Yeast, UA neg    POCT URINALYSIS DIPSTICK      Result Value Range   Color, UA dark yellow     Clarity, UA turbid     Glucose, UA neg     Bilirubin, UA mod     Ketones, UA 40     Spec Grav, UA >=1.030     Blood, UA neg     pH, UA 6.0     Protein, UA >300     Urobilinogen, UA 0.2     Nitrite, UA neg     Leukocytes, UA Negative    IFOBT (OCCULT BLOOD)      Result Value Range   IFOBT Negative  Primary X-Ray read by Dr. Florencia Reasons amount of air fluid  throughout his bowel. Mild gaseous distention.   5:13 PM-Discussed lab findings with pt. Pt states he does not check his DM properly.  Assessment & Plan:  Abdominal  pain, other specified site - Plan: POCT CBC, POCT glucose (manual entry), POCT SEDIMENTATION RATE, POCT UA - Microscopic Only, POCT urinalysis dipstick, Comprehensive metabolic panel, HELICOBACTER PYLORI  ANTIBODY, IGM, IFOBT POC (occult bld, rslt in office), DG Abd 2 Views, Lipase, CT Abdomen Pelvis W Contrast, BUN, Creatinine, serum - Concerned about persistent abd pain w/o etiology - will send pt for abd/pelvic CT scan w/ contrast at [facility] stat now.  Addendum: Pt w/ incarcerated hernia on CT scan - sent to ER for eval by surgeon.  Type II or unspecified type diabetes mellitus without mention of complication, not stated as uncontrolled - Plan: BUN, Creatinine, serum  No orders of the defined types were placed in this encounter.    I personally performed the services described in this documentation, which was scribed in my presence. The recorded information has been reviewed and considered, and addended by me as needed.  Dwayne Sorenson, MD MPH

## 2013-02-20 NOTE — ED Notes (Signed)
Pt complains of abd pain for two weeks, had a ct scan today and was sent her for further evaluation of a possible bowel obstruction

## 2013-02-20 NOTE — H&P (Signed)
Dwayne Mcgrath is an 57 y.o. male.   Chief Complaint: abdominal pain, vomiting HPI: patient is a 57 year old male who has had a known umbilical hernia for about 3 years. It has been gradually enlarging. He actually was scheduled for elective appointment in our office next week. However for 2 weeks he has had increased abdominal pain and some distention. He has sharp pains across his abdomen. This has been associated with nausea and vomiting intermittently for the last 2 weeks. He presented to the emergency department today for evaluation. Bowel movements have been minimal. No fever or chills.  Past Medical History  Diagnosis Date  . Diabetes mellitus   . GERD (gastroesophageal reflux disease)   . Hyperlipidemia   . Gout   . LVH (left ventricular hypertrophy)   . Hypertension     Past Surgical History  Procedure Laterality Date  . Thumb surgery    . Wisdom tooth extraction      Family History  Problem Relation Age of Onset  . Diabetes Mother    Social History:  reports that he has never smoked. He does not have any smokeless tobacco history on file. He reports that he drinks alcohol. He reports that he does not use illicit drugs.  Allergies: No Known Allergies  No current facility-administered medications for this encounter.   Current Outpatient Prescriptions  Medication Sig Dispense Refill  . allopurinol (ZYLOPRIM) 300 MG tablet Take 300 mg by mouth daily.       . benazepril-hydrochlorthiazide (LOTENSIN HCT) 10-12.5 MG per tablet Take 1 tablet by mouth daily.       [name] ezetimibe-simvastatin (VYTORIN) 10-40 MG per tablet Take 1 tablet by mouth daily.      . metFORMIN (GLUCOPHAGE) 1000 MG tablet Take 1,000 mg by mouth daily with breakfast.          Results for orders placed during the hospital encounter of 02/20/13 (from the past 48 hour(s))  CBC WITH DIFFERENTIAL     Status: None   Collection Time    02/20/13  9:29 PM      Result Value Range   WBC 9.8  4.0 - 10.5 K/uL   RBC  5.37  4.22 - 5.81 MIL/uL   Hemoglobin 15.9  13.0 - 17.0 g/dL   HCT 40.9  81.1 - 91.4 %   MCV 83.1  78.0 - 100.0 fL   MCH 29.6  26.0 - 34.0 pg   MCHC 35.7  30.0 - 36.0 g/dL   RDW 78.2  95.6 - 21.3 %   Platelets 229  150 - 400 K/uL   Neutrophils Relative % 70  43 - 77 %   Neutro Abs 6.9  1.7 - 7.7 K/uL   Lymphocytes Relative 22  12 - 46 %   Lymphs Abs 2.1  0.7 - 4.0 K/uL   Monocytes Relative 7  3 - 12 %   Monocytes Absolute 0.7  0.1 - 1.0 K/uL   Eosinophils Relative 1  0 - 5 %   Eosinophils Absolute 0.1  0.0 - 0.7 K/uL   Basophils Relative 0  0 - 1 %   Basophils Absolute 0.0  0.0 - 0.1 K/uL  BASIC METABOLIC PANEL     Status: Abnormal   Collection Time    02/20/13  9:29 PM      Result Value Range   Sodium 130 (*) 135 - 145 mEq/L   Potassium 3.8  3.5 - 5.1 mEq/L   Chloride 92 (*) 96 - 112 mEq/L  CO2 25  19 - 32 mEq/L   Glucose, Bld 141 (*) 70 - 99 mg/dL   BUN 17  6 - 23 mg/dL   Creatinine, Ser 4.78  0.50 - 1.35 mg/dL   Calcium 9.6  8.4 - 29.5 mg/dL   GFR calc non Af Amer 76 (*) >90 mL/min   GFR calc Af Amer 88 (*) >90 mL/min   Comment: (NOTE)     The eGFR has been calculated using the CKD EPI equation.     This calculation has not been validated in all clinical situations.     eGFR's persistently <90 mL/min signify possible Chronic Kidney     Disease.  LACTIC ACID, PLASMA     Status: None   Collection Time    02/20/13  9:29 PM      Result Value Range   Lactic Acid, Venous 1.2  0.5 - 2.2 mmol/L   Ct Abdomen Pelvis W Contrast  02/20/2013   CLINICAL DATA:  Abdominal pain. History of umbilical hernia. Small bowel obstruction.  EXAM: CT ABDOMEN AND PELVIS WITH CONTRAST  TECHNIQUE: Multidetector CT imaging of the abdomen and pelvis was performed using the standard protocol following bolus administration of intravenous contrast.  CONTRAST:  OMNIPAQUE IOHEXOL 300 MG/ML SOLN, 50mL OMNIPAQUE IOHEXOL 300 MG/ML SOLN  COMPARISON:  Radiographs 02/20/2013.  FINDINGS: Lung Bases:  Clear.  Liver: Lobulated cystic lesion is present in the left hepatic lobe measuring 25 mm (image 16 series 2). This likely represents a cyst. This could represent a cavernous hemangioma and the appears to be a tiny focus of peripheral enhancement. Regardless, the lesion is likely benign.  Spleen:  Normal.  Gallbladder:  Normal.  Common bile duct:  Normal.  Pancreas:  Normal.  Adrenal glands:  Normal.  Kidneys: Left upper pole simple renal cyst. Tiny subcentimeter right renal lesions probably also represent benign cysts. The ureters appear within normal limits. Normal enhancement and delayed excretion of contrast.  Stomach:  Normal, partially collapsed.  Small bowel: Small bowel obstruction is present secondary to a knuckle of small bowel all which is probably incarcerated in a periumbilical hernia. Large amount of fluid is present in the hernia sac. There is no perforation.  Colon: The cecum is pulled anteriorly and medially by a bowel all in the hernia sac. The appendix is not identified.  Pelvic Genitourinary: Urinary bladder appears normal. Small amount of simple free fluid is present in the anatomic pelvis.  Bones: No aggressive osseous lesions or fractures. L3-L4 are spondylosis.  Vasculature: Normal.  Body Wall: Periumbilical hernia measures about 4 cm in width. No other hernia identified.  IMPRESSION: 1. Periumbilical hernia containing a likely incarcerated loop of small bowel with complete small bowel obstruction. The herniated bowel loop has pulled the cecum anteriorly. 2. Probable hepatic cyst or hemangioma. No further evaluation recommended in a patient without a history of malignancy. 3. Left renal cysts. 4. Small amount of reactive ascites.   Electronically Signed   By: Andreas Newport M.D.   On: 02/20/2013 20:25   Dg Abd 2 Views  02/20/2013   CLINICAL DATA:  Abdominal pain.  EXAM: ABDOMEN - 2 VIEW  COMPARISON:  None.  FINDINGS: Multiple air-fluid levels are seen involving the small bowel  concerning for ileus or distal small bowel obstruction. No significant colonic dilatation is noted. Air is noted in the rectum. No abnormal calcifications are noted.  IMPRESSION: Multiple air-fluid levels are seen involving the small bowel suggesting ileus or distal small bowel obstruction.  Electronically Signed   By: Roque Lias M.D.   On: 02/20/2013 20:32    Review of Systems  Constitutional: Negative for fever and chills.  HENT: Negative.   Respiratory: Negative.   Cardiovascular: Negative.   Gastrointestinal: Positive for heartburn, nausea, vomiting, abdominal pain, constipation and melena. Negative for diarrhea and blood in stool.  Genitourinary: Negative.     Blood pressure 125/88, pulse 102, temperature 97.9 F (36.6 C), temperature source Oral, resp. rate 20, height 5\' 10"  (1.778 m), weight 168 lb (76.204 kg), SpO2 99.00%. Physical Exam  General: Alert, well-developed African American male, in no distress Skin: Warm and dry without rash or infection. HEENT: No palpable masses or thyromegaly. Sclera nonicteric. Pupils equal round and reactive. Oropharynx clear. Lymph nodes: No cervical, supraclavicular, or inguinal nodes palpable. Lungs: Breath sounds clear and equal without increased work of breathing Cardiovascular: Regular rate and rhythm without murmur. No JVD or edema. Peripheral pulses intact. Abdomen: moderately distended. There is a tender slightly erythematous mass about 7 cm at the umbilicus consistent with an incarcerated hernia. Mild diffuse tenderness but no guarding. No other masses or hernias or organomegaly appreciated. Extremities: No edema or joint swelling or deformity. No chronic venous stasis changes. Neurologic: Alert and fully oriented. Affect normal.  Assessment/Plan Incarcerated umbilical hernia with small bowel obstruction. He has been symptomatic for about 2 weeks. I recommend proceeding with emergency repair. He may require small bowel resection. We  discussed the indications and nature of the procedure and expected recovery as well as risks of anesthetic complications, bleeding, infection, possible use of mesh and recurrent hernia. All his questions were answered and he is in agreement.  Olanda Downie T 02/20/2013, 10:32 PM

## 2013-02-20 NOTE — ED Notes (Signed)
Pt transported to OR

## 2013-02-21 ENCOUNTER — Encounter (HOSPITAL_COMMUNITY): Payer: Self-pay | Admitting: *Deleted

## 2013-02-21 LAB — GLUCOSE, CAPILLARY
Glucose-Capillary: 119 mg/dL — ABNORMAL HIGH (ref 70–99)
Glucose-Capillary: 132 mg/dL — ABNORMAL HIGH (ref 70–99)
Glucose-Capillary: 100 mg/dL — ABNORMAL HIGH (ref 70–99)

## 2013-02-21 LAB — BASIC METABOLIC PANEL
CO2: 23 mEq/L (ref 19–32)
Calcium: 8.8 mg/dL (ref 8.4–10.5)
Chloride: 99 mEq/L (ref 96–112)
Creatinine, Ser: 0.98 mg/dL (ref 0.50–1.35)
GFR calc non Af Amer: 88 mL/min — ABNORMAL LOW (ref >90)
Glucose, Bld: 188 mg/dL — ABNORMAL HIGH (ref 70–99)
Sodium: 133 mEq/L — ABNORMAL LOW (ref 135–145)

## 2013-02-21 LAB — CBC
HCT: 37.9 % — ABNORMAL LOW (ref 39.0–52.0)
MCH: 29 pg (ref 26.0–34.0)
MCHC: 34.6 g/dL (ref 30.0–36.0)
MCV: 83.8 fL (ref 78.0–100.0)
Platelets: 184 10*3/uL (ref 150–400)
RBC: 4.52 MIL/uL (ref 4.22–5.81)
RDW: 13.6 % (ref 11.5–15.5)

## 2013-02-21 MED ORDER — LACTATED RINGERS IV SOLN
INTRAVENOUS | Status: DC | PRN
  Administered 2013-02-20 – 2013-02-21 (×3): via INTRAVENOUS

## 2013-02-21 MED ORDER — GLYCOPYRROLATE 0.2 MG/ML IJ SOLN
INTRAMUSCULAR | Status: DC | PRN
  Administered 2013-02-21: 0.6 mg via INTRAVENOUS

## 2013-02-21 MED ORDER — BENAZEPRIL-HYDROCHLOROTHIAZIDE 10-12.5 MG PO TABS: 1.0000 | ORAL_TABLET | Freq: Every day | ORAL | Status: DC

## 2013-02-21 MED ORDER — OXYCODONE-ACETAMINOPHEN 5-325 MG PO TABS
1.0000 | ORAL_TABLET | ORAL | Status: DC | PRN
  Administered 2013-02-21: 2 via ORAL
  Filled 2013-02-21: qty 2

## 2013-02-21 MED ORDER — ROCURONIUM BROMIDE 100 MG/10ML IV SOLN
INTRAVENOUS | Status: DC | PRN
  Administered 2013-02-20: 30 mg via INTRAVENOUS
  Administered 2013-02-21: 10 mg via INTRAVENOUS

## 2013-02-21 MED ORDER — KETAMINE HCL 10 MG/ML IJ SOLN
INTRAMUSCULAR | Status: DC | PRN
  Administered 2013-02-21: 50 mg via INTRAVENOUS

## 2013-02-21 MED ORDER — LABETALOL HCL 5 MG/ML IV SOLN
INTRAVENOUS | Status: DC | PRN
  Administered 2013-02-21 (×2): 5 mg via INTRAVENOUS

## 2013-02-21 MED ORDER — INSULIN ASPART 100 UNIT/ML ~~LOC~~ SOLN
0.0000 [IU] | Freq: Three times a day (TID) | SUBCUTANEOUS | Status: DC
  Administered 2013-02-21 – 2013-02-25 (×3): 2 [IU] via SUBCUTANEOUS

## 2013-02-21 MED ORDER — DEXTROSE 5 % IV SOLN: 2.0000 g | INTRAVENOUS | Status: DC

## 2013-02-21 MED ORDER — PROPOFOL 10 MG/ML IV BOLUS
INTRAVENOUS | Status: DC | PRN
  Administered 2013-02-20: 150 mg via INTRAVENOUS
  Administered 2013-02-21: 50 mg via INTRAVENOUS

## 2013-02-21 MED ORDER — BENAZEPRIL HCL 10 MG PO TABS
10.0000 mg | ORAL_TABLET | Freq: Every day | ORAL | Status: DC
  Administered 2013-02-21 – 2013-02-25 (×5): 10 mg via ORAL
  Filled 2013-02-21 (×5): qty 1

## 2013-02-21 MED ORDER — HEPARIN SODIUM (PORCINE) 5000 UNIT/ML IJ SOLN
5000.0000 [IU] | Freq: Three times a day (TID) | INTRAMUSCULAR | Status: DC
  Administered 2013-02-21 – 2013-02-25 (×13): 5000 [IU] via SUBCUTANEOUS
  Filled 2013-02-21 (×17): qty 1

## 2013-02-21 MED ORDER — HYDROMORPHONE HCL PF 1 MG/ML IJ SOLN
INTRAMUSCULAR | Status: DC | PRN
  Administered 2013-02-20 (×2): 1 mg via INTRAVENOUS

## 2013-02-21 MED ORDER — 0.9 % SODIUM CHLORIDE (POUR BTL) OPTIME
TOPICAL | Status: DC | PRN
  Administered 2013-02-21: 1000 mL

## 2013-02-21 MED ORDER — SUCCINYLCHOLINE CHLORIDE 20 MG/ML IJ SOLN
INTRAMUSCULAR | Status: DC | PRN
  Administered 2013-02-20: 100 mg via INTRAVENOUS

## 2013-02-21 MED ORDER — DEXTROSE 5 % IV SOLN
1.0000 g | Freq: Four times a day (QID) | INTRAVENOUS | Status: AC
  Administered 2013-02-21 (×3): 1 g via INTRAVENOUS
  Filled 2013-02-21 (×3): qty 1

## 2013-02-21 MED ORDER — NEOSTIGMINE METHYLSULFATE 1 MG/ML IJ SOLN
INTRAMUSCULAR | Status: DC | PRN
  Administered 2013-02-21: 5 mg via INTRAVENOUS

## 2013-02-21 MED ORDER — HYDROCHLOROTHIAZIDE 12.5 MG PO CAPS
12.5000 mg | ORAL_CAPSULE | Freq: Every day | ORAL | Status: DC
  Administered 2013-02-21 – 2013-02-25 (×5): 12.5 mg via ORAL
  Filled 2013-02-21 (×5): qty 1

## 2013-02-21 MED ORDER — HYDROMORPHONE HCL PF 1 MG/ML IJ SOLN: 0.2500 mg | INTRAMUSCULAR | Status: DC | PRN

## 2013-02-21 MED ORDER — PHENYLEPHRINE HCL 10 MG/ML IJ SOLN
INTRAMUSCULAR | Status: DC | PRN
  Administered 2013-02-20: 160 ug via INTRAVENOUS
  Administered 2013-02-20: 80 ug via INTRAVENOUS
  Administered 2013-02-21: 160 ug via INTRAVENOUS

## 2013-02-21 MED ORDER — ONDANSETRON HCL 4 MG/2ML IJ SOLN
INTRAMUSCULAR | Status: DC | PRN
  Administered 2013-02-21: 4 mg via INTRAVENOUS

## 2013-02-21 MED ORDER — PNEUMOCOCCAL VAC POLYVALENT 25 MCG/0.5ML IJ INJ
0.5000 mL | INJECTION | Freq: Once | INTRAMUSCULAR | Status: AC
  Administered 2013-02-21: 11:00:00 0.5 mL via INTRAMUSCULAR
  Filled 2013-02-21: qty 0.5

## 2013-02-21 MED ORDER — MORPHINE SULFATE 2 MG/ML IJ SOLN: 2.0000 mg | INTRAMUSCULAR | Status: DC | PRN

## 2013-02-21 MED ORDER — LACTATED RINGERS IV SOLN: INTRAVENOUS | Status: DC

## 2013-02-21 MED ORDER — PANTOPRAZOLE SODIUM 40 MG PO TBEC
40.0000 mg | DELAYED_RELEASE_TABLET | Freq: Every day | ORAL | Status: DC
  Administered 2013-02-22 – 2013-02-25 (×5): 40 mg via ORAL
  Filled 2013-02-21 (×4): qty 1

## 2013-02-21 MED ORDER — DEXAMETHASONE SODIUM PHOSPHATE 4 MG/ML IJ SOLN
INTRAMUSCULAR | Status: DC | PRN
  Administered 2013-02-21: 10 mg via INTRAVENOUS

## 2013-02-21 MED ORDER — MIDAZOLAM HCL 5 MG/5ML IJ SOLN
INTRAMUSCULAR | Status: DC | PRN
  Administered 2013-02-20 (×2): 1 mg via INTRAVENOUS
  Administered 2013-02-21: 2 mg via INTRAVENOUS

## 2013-02-21 MED ORDER — SODIUM CHLORIDE 0.9 % IV SOLN
INTRAVENOUS | Status: DC
  Administered 2013-02-21 – 2013-02-23 (×4): via INTRAVENOUS
  Filled 2013-02-21 (×2): qty 1000

## 2013-02-21 MED ORDER — ONDANSETRON HCL 4 MG PO TABS: 4.0000 mg | ORAL_TABLET | Freq: Four times a day (QID) | ORAL | Status: DC | PRN

## 2013-02-21 MED ORDER — POTASSIUM CHLORIDE IN NACL 20-0.9 MEQ/L-% IV SOLN
INTRAVENOUS | Status: DC
  Administered 2013-02-21: 04:00:00 via INTRAVENOUS
  Filled 2013-02-21 (×3): qty 1000

## 2013-02-21 MED ORDER — ONDANSETRON HCL 4 MG/2ML IJ SOLN: 4.0000 mg | Freq: Four times a day (QID) | INTRAMUSCULAR | Status: DC | PRN

## 2013-02-21 MED ORDER — METOCLOPRAMIDE HCL 5 MG/ML IJ SOLN
INTRAMUSCULAR | Status: DC | PRN
  Administered 2013-02-20: 10 mg via INTRAVENOUS

## 2013-02-21 NOTE — Progress Notes (Signed)
Patient ID: Dwayne Mcgrath, male   DOB: March 09, 1956, 57 y.o.   MRN: [id number] 1 Day Post-Op  Subjective: Pt feels ok this morning.  No nausea.  Some abdominal pain.  No flatus  Objective: Vital signs in last 24 hours: Temp:  [97.4 F (36.3 C)-98.9 F (37.2 C)] 97.9 F (36.6 C) (10/30 0623) Pulse Rate:  [53-102] 70 (10/30 1020) Resp:  [9-20] 10 (10/30 0623) BP: (116-167)/(72-119) 139/78 mmHg (10/30 1020) SpO2:  [91 %-100 %] 99 % (10/30 0623) Weight:  [168 lb (76.204 kg)-173 lb 11.6 oz (78.8 kg)] 173 lb 11.6 oz (78.8 kg) (10/30 0316)    Intake/Output from previous day: 10/29 0701 - 10/30 0700 In: 3295.8 [I.V.:3245.8; IV Piggyback:50] Out: 330 [Urine:330] Intake/Output this shift: Total I/O In: 220 [P.O.:220] Out: 450 [Urine:450]  PE: Abd: soft, appropriately tender, +BS, ND, incisional dressing is clean and intact Heart: regular Lungs: CTAB  Lab Results:   Recent Labs  02/20/13 2129 02/21/13 0521  WBC 9.8 10.2  HGB 15.9 13.1  HCT 44.6 37.9*  PLT 229 184   BMET  Recent Labs  02/20/13 2129 02/21/13 0521  NA 130* 133*  K 3.8 5.3*  CL 92* 99  CO2 25 23  GLUCOSE 141* 188*  BUN 17 19  CREATININE 1.05 0.98  CALCIUM 9.6 8.8   PT/INR No results found for this basename: LABPROT, INR,  in the last 72 hours CMP     Component Value Date/Time   NA 133* 02/21/2013 0521   K 5.3* 02/21/2013 0521   CL 99 02/21/2013 0521   CO2 23 02/21/2013 0521   GLUCOSE 188* 02/21/2013 0521   BUN 19 02/21/2013 0521   CREATININE 0.98 02/21/2013 0521   CREATININE 1.08 02/20/2013 1810   CALCIUM 8.8 02/21/2013 0521   PROT 8.3 02/20/2013 1652   ALBUMIN 5.1 02/20/2013 1652   AST 17 02/20/2013 1652   ALT 25 02/20/2013 1652   ALKPHOS 68 02/20/2013 1652   BILITOT 0.8 02/20/2013 1652   GFRNONAA 88* 02/21/2013 0521   GFRAA >90 02/21/2013 0521   Lipase     Component Value Date/Time   LIPASE 39 02/20/2013 1713       Studies/Results: Ct Abdomen Pelvis W Contrast  02/20/2013    CLINICAL DATA:  Abdominal pain. History of umbilical hernia. Small bowel obstruction.  EXAM: CT ABDOMEN AND PELVIS WITH CONTRAST  TECHNIQUE: Multidetector CT imaging of the abdomen and pelvis was performed using the standard protocol following bolus administration of intravenous contrast.  CONTRAST:  OMNIPAQUE IOHEXOL 300 MG/ML SOLN, 50mL OMNIPAQUE IOHEXOL 300 MG/ML SOLN  COMPARISON:  Radiographs 02/20/2013.  FINDINGS: Lung Bases: Clear.  Liver: Lobulated cystic lesion is present in the left hepatic lobe measuring 25 mm (image 16 series 2). This likely represents a cyst. This could represent a cavernous hemangioma and the appears to be a tiny focus of peripheral enhancement. Regardless, the lesion is likely benign.  Spleen:  Normal.  Gallbladder:  Normal.  Common bile duct:  Normal.  Pancreas:  Normal.  Adrenal glands:  Normal.  Kidneys: Left upper pole simple renal cyst. Tiny subcentimeter right renal lesions probably also represent benign cysts. The ureters appear within normal limits. Normal enhancement and delayed excretion of contrast.  Stomach:  Normal, partially collapsed.  Small bowel: Small bowel obstruction is present secondary to a knuckle of small bowel all which is probably incarcerated in a periumbilical hernia. Large amount of fluid is present in the hernia sac. There is no perforation.  Colon:  The cecum is pulled anteriorly and medially by a bowel all in the hernia sac. The appendix is not identified.  Pelvic Genitourinary: Urinary bladder appears normal. Small amount of simple free fluid is present in the anatomic pelvis.  Bones: No aggressive osseous lesions or fractures. L3-L4 are spondylosis.  Vasculature: Normal.  Body Wall: Periumbilical hernia measures about 4 cm in width. No other hernia identified.  IMPRESSION: 1. Periumbilical hernia containing a likely incarcerated loop of small bowel with complete small bowel obstruction. The herniated bowel loop has pulled the cecum anteriorly. 2.  Probable hepatic cyst or hemangioma. No further evaluation recommended in a patient without a history of malignancy. 3. Left renal cysts. 4. Small amount of reactive ascites.   Electronically Signed   By: Andreas Newport M.D.   On: 02/20/2013 20:25   Dg Abd 2 Views  02/20/2013   CLINICAL DATA:  Abdominal pain.  EXAM: ABDOMEN - 2 VIEW  COMPARISON:  None.  FINDINGS: Multiple air-fluid levels are seen involving the small bowel concerning for ileus or distal small bowel obstruction. No significant colonic dilatation is noted. Air is noted in the rectum. No abnormal calcifications are noted.  IMPRESSION: Multiple air-fluid levels are seen involving the small bowel suggesting ileus or distal small bowel obstruction.   Electronically Signed   By: Roque Lias M.D.   On: 02/20/2013 20:32    Anti-infectives: Anti-infectives   Start     Dose/Rate Route Frequency Ordered Stop   02/21/13 0600  cefOXitin (MEFOXIN) 2 g in dextrose 5 % 50 mL IVPB  Status:  Discontinued     2 g 100 mL/hr over 30 Minutes Intravenous On call to O.R. 02/21/13 0342 02/21/13 0348   02/21/13 0400  cefOXitin (MEFOXIN) 1 g in dextrose 5 % 50 mL IVPB     1 g 100 mL/hr over 30 Minutes Intravenous Every 6 hours 02/21/13 0342 02/21/13 2159       Assessment/Plan  1. S/p repair of incarcerated umbilical hernia repair with primary closure 2. DM  Plan: 1. Will allow clear liquids today 2. SSI for DM coverage 3. Dc foley today 4. Mobilize and pulm toilet  LOS: 1 day    OSBORNE,KELLY E 02/21/2013, 12:04 PM Pager: [phone] Feeling better this am.  Abdominal pain localized to incision.  Tolerating clears.

## 2013-02-21 NOTE — Anesthesia Postprocedure Evaluation (Signed)
  Anesthesia Post-op Note  Patient: Dwayne Mcgrath  Procedure(s) Performed: Procedure(s) (LRB): Incarcerated Umbilical Hernia Repair  (N/A)  Patient Location: PACU  Anesthesia Type: General  Level of Consciousness: awake and alert   Airway and Oxygen Therapy: Patient Spontanous Breathing  Post-op Pain: mild  Post-op Assessment: Post-op Vital signs reviewed, Patient's Cardiovascular Status Stable, Respiratory Function Stable, Patent Airway and No signs of Nausea or vomiting  Last Vitals:  Filed Vitals:   02/20/13 2055  BP: 125/88  Pulse: 102  Temp: 36.6 C  Resp: 20    Post-op Vital Signs: stable   Complications: No apparent anesthesia complications

## 2013-02-21 NOTE — Op Note (Signed)
Preoperative Diagnosis: Incarcerated umbilical hernia [552.1]  Postoprative Diagnosis: Incarcerated umbilical hernia [552.1]  Procedure: Procedure(s): Incarcerated Umbilical Hernia Repair    Surgeon: Glenna Fellows T   Assistants: none  Anesthesia:  General endotracheal anesthesia  Indications: patient is a 57 year old male who has had a known hernia at his umbilicus for about 3 years. It has gotten gradually worse. He has had gradually increasing symptoms with pain and bloating. For 2 weeks he has had significant abdominal distention intermittent crampy abdominal pain and frequent vomiting. He presented to the emergency department today. CT scan shows incarcerated small intestine and an umbilical hernia with small bowel obstruction. Exam shows a significant mass at the umbilicus and abdominal distention. I recommended emergency repair. We discussed the indications and nature of the surgery as well as recovery and was of anesthetic complications, bleeding, infection, possible need for bowel resection and recurrent hernia. He understands and agrees to proceed.  Procedure Detail:  Patient was brought to the operating room, placed in supine position on the operating table, and general endotracheal anesthesia induced.  He received preoperative broad-spectrum IV antibiotics. The abdomen was widely sterilely prepped and draped. Patient timeout was performed and the procedure verified. I made a curvilinear transverse infraumbilical incision and dissection was carried down to the subcutaneous tissue using cautery. The umbilical skin was then dissected up off the hernia sac. The hernia sac was quite large and very adherent to overlying skin and surrounding subcutaneous tissue. The hernia sac was completely dissected away from surrounding subcutaneous tissue down to the level of the fascia. The hernia sac was very thick. I opened it and it contained serous fluid. At the base of the hernia sac was an  incarcerated loop of small bowel. It appeared viable without any evidence of ischemia. However the loop was densely adherent to the surrounding peritoneum and the neck of the hernia. The hernia sac was very thick and appeared chronically inflamed. I could bring out the proximal small bowel which was very dilated and distally I initially could not identify the bowel as it was so adherent to the hernia sac. Using careful sharp and cautery dissection I was able to separate the small bowel loops from the hernia sac and this area that was herniated was right at the ileocecal valve with the distal portion of the small bowel in the hernia right at the ileocecal valve. That shows a bring up the cecum and appendix and completely mobilize the bowel away from the hernia sac. The loop of ileum measuring about 10 cm it had been in the hernia sac was extremely thickened and chronically obstructed. There was some discoloration where the ileum right at the ileocecal valve had gone through the neck of the hernia but this appeared superficial and hemorrhagic without evidence of any full thickness injury to the bowel. There did appear to be some serosal injury here from the incarceration and I closed this with some interrupted 3-0 silk. The bowel could then be completely freed from the anterior abdominal wall and reduced into the abdomen. Again the hernia sac was extremely thick up to about a centimeter. It was completely excised with cautery back to the fascial edge. The fascial defect measured about 5 cm. I was reluctant to use permanent mesh to to the possibility of contamination with the serosal injury to the bowel and the fact that there was some mild erythema of the skin overlying the hernia. I therefore elected to repair the defect primarily. The fascia was closed transversely  with interrupted #1 Novafil sutures. The wound was irrigated and the subcutaneous closed with interrupted 3-0 Vicryl. The skin was closed with staples.  Sponge needle and instrument counts were correct.    Findings: As above  Estimated Blood Loss:  Minimal         Drains: none  Blood Given: none          Specimens: hernia sac        Complications:  * No complications entered in OR log *         Disposition: PACU - hemodynamically stable.         Condition: stable

## 2013-02-21 NOTE — Transfer of Care (Signed)
Immediate Anesthesia Transfer of Care Note  Patient: Dwayne Mcgrath  Procedure(s) Performed: Procedure(s): Incarcerated Umbilical Hernia Repair  (N/A)  Patient Location: PACU  Anesthesia Type:General  Level of Consciousness: Patient easily awoken, sedated, comfortable, cooperative, following commands, responds to stimulation.   Airway & Oxygen Therapy: Patient spontaneously breathing, ventilating well, oxygen via simple oxygen mask.  Post-op Assessment: Report given to PACU RN, vital signs reviewed and stable, moving all extremities.   Post vital signs: Reviewed and stable.  Complications: No apparent anesthesia complications

## 2013-02-21 NOTE — Care Management Note (Addendum)
    Page 1 of 1   02/25/2013     1:42:13 PM   CARE MANAGEMENT NOTE 02/25/2013  Patient:  Dwayne Mcgrath, Dwayne Mcgrath   Account Number:  [id number]  Date Initiated:  02/21/2013  Documentation initiated by:  Lanier Clam  Subjective/Objective Assessment:   57 Y/O M ADMITTED W/ABD PAIN.     Action/Plan:   FROM HOME.   Anticipated DC Date:  02/25/2013   Anticipated DC Plan:  HOME/SELF CARE      DC Planning Services  CM consult      Choice offered to / List presented to:             Status of service:  Completed, signed off Medicare Important Message given?   (If response is "NO", the following Medicare IM given date fields will be blank) Date Medicare IM given:   Date Additional Medicare IM given:    Discharge Disposition:  HOME/SELF CARE  Per UR Regulation:  Reviewed for med. necessity/level of care/duration of stay  If discussed at Long Length of Stay Meetings, dates discussed:    Comments:  02/25/13 Kela Baccari RN,BSN NCM [phone] D/C HOME NO NEEDS.  02/21/13 Mckensi Redinger RN,BSN NCM [phone] S/P LAP HERNIA REPAIR.NO ANTICIPATED D/C NEEDS.

## 2013-02-22 ENCOUNTER — Encounter (HOSPITAL_COMMUNITY): Payer: Self-pay | Admitting: General Surgery

## 2013-02-22 LAB — GLUCOSE, CAPILLARY
Glucose-Capillary: 148 mg/dL — ABNORMAL HIGH (ref 70–99)
Glucose-Capillary: 99 mg/dL (ref 70–99)
Glucose-Capillary: 127 mg/dL — ABNORMAL HIGH (ref 70–99)

## 2013-02-22 LAB — HELICOBACTER PYLORI  ANTIBODY, IGM: Helicobacter pylori, IgM: 2.3 U/mL (ref <9.0)

## 2013-02-22 MED ORDER — METFORMIN HCL 500 MG PO TABS
1000.0000 mg | ORAL_TABLET | Freq: Every day | ORAL | Status: DC
  Administered 2013-02-22 – 2013-02-25 (×4): 1000 mg via ORAL
  Filled 2013-02-22 (×5): qty 2

## 2013-02-22 NOTE — Progress Notes (Signed)
Patient ID: Dwayne Mcgrath, male   DOB: 1956-04-20, 57 y.o.   MRN: [id number] 2 Days Post-Op  Subjective: Pt feels ok today.  Some soreness.  No flatus, but tolerating clear liquids with no nausea or vomiting  Objective: Vital signs in last 24 hours: Temp:  [98 F (36.7 C)-98.3 F (36.8 C)] 98 F (36.7 C) (10/31 4098) Pulse Rate:  [79-97] 97 (10/31 0633) Resp:  [18-20] 20 (10/31 1191) BP: (117-145)/(64-79) 145/75 mmHg (10/31 0633) SpO2:  [94 %-100 %] 94 % (10/31 4782) Last BM Date: 02/20/13  Intake/Output from previous day: 10/30 0701 - 10/31 0700 In: 3276.2 [P.O.:820; I.V.:2456.2] Out: 450 [Urine:450] Intake/Output this shift:    PE: Abd: soft, appropriately tender, incision c/d/i with staples present, small ecchymosis superior to incision  Lab Results:   Recent Labs  02/20/13 2129 02/21/13 0521  WBC 9.8 10.2  HGB 15.9 13.1  HCT 44.6 37.9*  PLT 229 184   BMET  Recent Labs  02/20/13 2129 02/21/13 0521  NA 130* 133*  K 3.8 5.3*  CL 92* 99  CO2 25 23  GLUCOSE 141* 188*  BUN 17 19  CREATININE 1.05 0.98  CALCIUM 9.6 8.8   PT/INR No results found for this basename: LABPROT, INR,  in the last 72 hours CMP     Component Value Date/Time   NA 133* 02/21/2013 0521   K 5.3* 02/21/2013 0521   CL 99 02/21/2013 0521   CO2 23 02/21/2013 0521   GLUCOSE 188* 02/21/2013 0521   BUN 19 02/21/2013 0521   CREATININE 0.98 02/21/2013 0521   CREATININE 1.08 02/20/2013 1810   CALCIUM 8.8 02/21/2013 0521   PROT 8.3 02/20/2013 1652   ALBUMIN 5.1 02/20/2013 1652   AST 17 02/20/2013 1652   ALT 25 02/20/2013 1652   ALKPHOS 68 02/20/2013 1652   BILITOT 0.8 02/20/2013 1652   GFRNONAA 88* 02/21/2013 0521   GFRAA >90 02/21/2013 0521   Lipase     Component Value Date/Time   LIPASE 39 02/20/2013 1713       Studies/Results: Ct Abdomen Pelvis W Contrast  02/20/2013   CLINICAL DATA:  Abdominal pain. History of umbilical hernia. Small bowel obstruction.  EXAM: CT ABDOMEN  AND PELVIS WITH CONTRAST  TECHNIQUE: Multidetector CT imaging of the abdomen and pelvis was performed using the standard protocol following bolus administration of intravenous contrast.  CONTRAST:  OMNIPAQUE IOHEXOL 300 MG/ML SOLN, 50mL OMNIPAQUE IOHEXOL 300 MG/ML SOLN  COMPARISON:  Radiographs 02/20/2013.  FINDINGS: Lung Bases: Clear.  Liver: Lobulated cystic lesion is present in the left hepatic lobe measuring 25 mm (image 16 series 2). This likely represents a cyst. This could represent a cavernous hemangioma and the appears to be a tiny focus of peripheral enhancement. Regardless, the lesion is likely benign.  Spleen:  Normal.  Gallbladder:  Normal.  Common bile duct:  Normal.  Pancreas:  Normal.  Adrenal glands:  Normal.  Kidneys: Left upper pole simple renal cyst. Tiny subcentimeter right renal lesions probably also represent benign cysts. The ureters appear within normal limits. Normal enhancement and delayed excretion of contrast.  Stomach:  Normal, partially collapsed.  Small bowel: Small bowel obstruction is present secondary to a knuckle of small bowel all which is probably incarcerated in a periumbilical hernia. Large amount of fluid is present in the hernia sac. There is no perforation.  Colon: The cecum is pulled anteriorly and medially by a bowel all in the hernia sac. The appendix is not identified.  Pelvic  Genitourinary: Urinary bladder appears normal. Small amount of simple free fluid is present in the anatomic pelvis.  Bones: No aggressive osseous lesions or fractures. L3-L4 are spondylosis.  Vasculature: Normal.  Body Wall: Periumbilical hernia measures about 4 cm in width. No other hernia identified.  IMPRESSION: 1. Periumbilical hernia containing a likely incarcerated loop of small bowel with complete small bowel obstruction. The herniated bowel loop has pulled the cecum anteriorly. 2. Probable hepatic cyst or hemangioma. No further evaluation recommended in a patient without a history of  malignancy. 3. Left renal cysts. 4. Small amount of reactive ascites.   Electronically Signed   By: Andreas Newport M.D.   On: 02/20/2013 20:25   Dg Abd 2 Views  02/20/2013   CLINICAL DATA:  Abdominal pain.  EXAM: ABDOMEN - 2 VIEW  COMPARISON:  None.  FINDINGS: Multiple air-fluid levels are seen involving the small bowel concerning for ileus or distal small bowel obstruction. No significant colonic dilatation is noted. Air is noted in the rectum. No abnormal calcifications are noted.  IMPRESSION: Multiple air-fluid levels are seen involving the small bowel suggesting ileus or distal small bowel obstruction.   Electronically Signed   By: Roque Lias M.D.   On: 02/20/2013 20:32    Anti-infectives: Anti-infectives   Start     Dose/Rate Route Frequency Ordered Stop   02/21/13 0600  cefOXitin (MEFOXIN) 2 g in dextrose 5 % 50 mL IVPB  Status:  Discontinued     2 g 100 mL/hr over 30 Minutes Intravenous On call to O.R. 02/21/13 0342 02/21/13 0348   02/21/13 0400  cefOXitin (MEFOXIN) 1 g in dextrose 5 % 50 mL IVPB     1 g 100 mL/hr over 30 Minutes Intravenous Every 6 hours 02/21/13 0342 02/21/13 1649       Assessment/Plan  1. S/p repair of incarcerated umbilical hernia 2. DM  Plan: 1. Advance to full liquids.  Would not advanced past this as patient has not passed flatus yet, but has great BS 2. Mobilize   3. Resume home DM meds and use SSI as neeeded  LOS: 2 days    OSBORNE,KELLY E 02/22/2013, 10:30 AM Pager: [phone] "sore" but feels better than preop.  Advance diet and awaiting bowel function

## 2013-02-23 LAB — GLUCOSE, CAPILLARY
Glucose-Capillary: 103 mg/dL — ABNORMAL HIGH (ref 70–99)
Glucose-Capillary: 93 mg/dL (ref 70–99)
Glucose-Capillary: 96 mg/dL (ref 70–99)

## 2013-02-23 NOTE — Progress Notes (Signed)
Patient ID: Dwayne Mcgrath, male   DOB: [birth year], 57 y.o.   MRN: [id number] 3 Days Post-Op  Subjective: Pt feels ok today.  Some soreness, controlled with meds.  Pos flatus, and tolerating clear liquids with no nausea or vomiting.  No BM's yet  Objective: Vital signs in last 24 hours: Temp:  [97.1 F (36.2 C)-99.1 F (37.3 C)] 97.1 F (36.2 C) (11/01 0538) Pulse Rate:  [88-89] 89 (11/01 0538) Resp:  [18-20] 18 (11/01 0538) BP: (131-153)/(73-89) 150/89 mmHg (11/01 0538) SpO2:  [95 %-97 %] 96 % (11/01 0538) Last BM Date: 02/20/13  Intake/Output from previous day: 10/31 0701 - 11/01 0700 In: 2540 [P.O.:240; I.V.:2300] Out: 5 [Urine:5] Intake/Output this shift:    PE: Abd: soft, appropriately tender, incision c/d/i with staples present, small ecchymosis superior to incision  Lab Results:   Recent Labs  02/20/13 2129 02/21/13 0521  WBC 9.8 10.2  HGB 15.9 13.1  HCT 44.6 37.9*  PLT 229 184   BMET  Recent Labs  02/20/13 2129 02/21/13 0521  NA 130* 133*  K 3.8 5.3*  CL 92* 99  CO2 25 23  GLUCOSE 141* 188*  BUN 17 19  CREATININE 1.05 0.98  CALCIUM 9.6 8.8   PT/INR No results found for this basename: LABPROT, INR,  in the last 72 hours CMP     Component Value Date/Time   NA 133* 02/21/2013 0521   K 5.3* 02/21/2013 0521   CL 99 02/21/2013 0521   CO2 23 02/21/2013 0521   GLUCOSE 188* 02/21/2013 0521   BUN 19 02/21/2013 0521   CREATININE 0.98 02/21/2013 0521   CREATININE 1.08 02/20/2013 1810   CALCIUM 8.8 02/21/2013 0521   PROT 8.3 02/20/2013 1652   ALBUMIN 5.1 02/20/2013 1652   AST 17 02/20/2013 1652   ALT 25 02/20/2013 1652   ALKPHOS 68 02/20/2013 1652   BILITOT 0.8 02/20/2013 1652   GFRNONAA 88* 02/21/2013 0521   GFRAA >90 02/21/2013 0521   Lipase     Component Value Date/Time   LIPASE 39 02/20/2013 1713       Studies/Results: No results found.  Anti-infectives: Anti-infectives   Start     Dose/Rate Route Frequency Ordered Stop   02/21/13 0600  cefOXitin (MEFOXIN) 2 g in dextrose 5 % 50 mL IVPB  Status:  Discontinued     2 g 100 mL/hr over 30 Minutes Intravenous On call to O.R. 02/21/13 0342 02/21/13 0348   02/21/13 0400  cefOXitin (MEFOXIN) 1 g in dextrose 5 % 50 mL IVPB     1 g 100 mL/hr over 30 Minutes Intravenous Every 6 hours 02/21/13 0342 02/21/13 1649       Assessment/Plan  1. S/p repair of incarcerated umbilical hernia 2. DM  Plan: 1.Cont full liquids.  Advance to diabetic diet as tolerated 2. Mobilize   3. Cont home DM meds and use SSI as neeeded  LOS: 3 days    Zinnia Tindall C. 02/23/2013, 8:25 AM Pager: [phone] "sore" but feels better than preop.  Advance diet and awaiting bowel function

## 2013-02-24 LAB — GLUCOSE, CAPILLARY
Glucose-Capillary: 92 mg/dL (ref 70–99)
Glucose-Capillary: 108 mg/dL — ABNORMAL HIGH (ref 70–99)
Glucose-Capillary: 128 mg/dL — ABNORMAL HIGH (ref 70–99)

## 2013-02-24 MED ORDER — DOCUSATE SODIUM 100 MG PO CAPS
100.0000 mg | ORAL_CAPSULE | Freq: Two times a day (BID) | ORAL | Status: DC
  Administered 2013-02-24 – 2013-02-25 (×3): 100 mg via ORAL
  Filled 2013-02-24 (×4): qty 1

## 2013-02-24 MED ORDER — POLYETHYLENE GLYCOL 3350 17 G PO PACK
17.0000 g | PACK | Freq: Every day | ORAL | Status: DC | PRN
  Administered 2013-02-24: 14:00:00 17 g via ORAL
  Filled 2013-02-24: qty 1

## 2013-02-24 MED ORDER — BISACODYL 10 MG RE SUPP
10.0000 mg | Freq: Once | RECTAL | Status: AC
  Administered 2013-02-24: 09:00:00 10 mg via RECTAL
  Filled 2013-02-24: qty 1

## 2013-02-24 NOTE — Plan of Care (Signed)
Problem: Phase II Progression Outcomes Goal: Return of bowel function (flatus, BM) IF ABDOMINAL SURGERY:  Outcome: Progressing Had small BM per patient

## 2013-02-24 NOTE — Progress Notes (Signed)
Patient ID: Dwayne Mcgrath, male   DOB: [birth year], 57 y.o.   MRN: [id number] 4 Days Post-Op  Subjective: Pt feels ok today.  Some soreness, controlled with PO meds.  Pos flatus, and tolerating full liquids with no nausea or vomiting.  No BM's yet  Objective: Vital signs in last 24 hours: Temp:  [98 F (36.7 C)-98.1 F (36.7 C)] 98 F (36.7 C) (11/02 0618) Pulse Rate:  [70-80] 70 (11/02 0618) Resp:  [20-22] 22 (11/02 0618) BP: (146-154)/(79-90) 154/87 mmHg (11/02 0618) SpO2:  [97 %-98 %] 97 % (11/02 0618) Last BM Date: 02/21/13  Intake/Output from previous day: 11/01 0701 - 11/02 0700 In: 600 [P.O.:600] Out: 4 [Urine:3; Stool:1] Intake/Output this shift:    PE: Abd: soft, appropriately tender, incision c/d/i with staples present  Lab Results:  No results found for this basename: WBC, HGB, HCT, PLT,  in the last 72 hours BMET No results found for this basename: NA, K, CL, CO2, GLUCOSE, BUN, CREATININE, CALCIUM,  in the last 72 hours PT/INR No results found for this basename: LABPROT, INR,  in the last 72 hours CMP     Component Value Date/Time   NA 133* 02/21/2013 0521   K 5.3* 02/21/2013 0521   CL 99 02/21/2013 0521   CO2 23 02/21/2013 0521   GLUCOSE 188* 02/21/2013 0521   BUN 19 02/21/2013 0521   CREATININE 0.98 02/21/2013 0521   CREATININE 1.08 02/20/2013 1810   CALCIUM 8.8 02/21/2013 0521   PROT 8.3 02/20/2013 1652   ALBUMIN 5.1 02/20/2013 1652   AST 17 02/20/2013 1652   ALT 25 02/20/2013 1652   ALKPHOS 68 02/20/2013 1652   BILITOT 0.8 02/20/2013 1652   GFRNONAA 88* 02/21/2013 0521   GFRAA >90 02/21/2013 0521   Lipase     Component Value Date/Time   LIPASE 39 02/20/2013 1713       Studies/Results: No results found.  Anti-infectives: Anti-infectives   Start     Dose/Rate Route Frequency Ordered Stop   02/21/13 0600  cefOXitin (MEFOXIN) 2 g in dextrose 5 % 50 mL IVPB  Status:  Discontinued     2 g 100 mL/hr over 30 Minutes Intravenous On call to  O.R. 02/21/13 0342 02/21/13 0348   02/21/13 0400  cefOXitin (MEFOXIN) 1 g in dextrose 5 % 50 mL IVPB     1 g 100 mL/hr over 30 Minutes Intravenous Every 6 hours 02/21/13 0342 02/21/13 1649       Assessment/Plan  1. S/p repair of incarcerated umbilical hernia 2. DM  Plan: 1.Reg DM diet   2. Mobilize   3. Cont home DM meds and use SSI as needed 4. Suppository today to stimulate bowel function 5. Most likely d/c tomorrow   LOS: 4 days    Romie Levee C. 02/24/2013, 8:42 AM Pager: [phone] "sore" but feels better than preop.  Advance diet and awaiting bowel function

## 2013-02-25 ENCOUNTER — Encounter (HOSPITAL_COMMUNITY): Payer: Self-pay | Admitting: Surgery

## 2013-02-25 DIAGNOSIS — K420 Umbilical hernia with obstruction, without gangrene: Secondary | ICD-10-CM | POA: Diagnosis present

## 2013-02-25 DIAGNOSIS — K219 Gastro-esophageal reflux disease without esophagitis: Secondary | ICD-10-CM | POA: Insufficient documentation

## 2013-02-25 DIAGNOSIS — K56609 Unspecified intestinal obstruction, unspecified as to partial versus complete obstruction: Secondary | ICD-10-CM

## 2013-02-25 DIAGNOSIS — E119 Type 2 diabetes mellitus without complications: Secondary | ICD-10-CM

## 2013-02-25 LAB — GLUCOSE, CAPILLARY
Glucose-Capillary: 97 mg/dL (ref 70–99)
Glucose-Capillary: 132 mg/dL — ABNORMAL HIGH (ref 70–99)
Glucose-Capillary: 99 mg/dL (ref 70–99)

## 2013-02-25 MED ORDER — OXYCODONE HCL 5 MG PO TABS: 5.0000 mg | ORAL_TABLET | ORAL | Status: DC | PRN

## 2013-02-25 NOTE — Progress Notes (Signed)
Patient discharged home with family, discharge instruction given and explained to patient and he verbalized understanding, denies any pain/distress. Incision site clean/dry, staples intact, no sign of infection noted (no redness or drainage noted). F/U outpatient with MD. Accompanied home by family.

## 2013-02-25 NOTE — Plan of Care (Signed)
Problem: Phase III Progression Outcomes Goal: Voiding independently Outcome: Progressing Pt has had small bowel movement Goal: Discharge plan remains appropriate-arrangements made Outcome: Progressing Pt will be discharged in the morning 11/3

## 2013-02-25 NOTE — Discharge Summary (Signed)
Physician Discharge Summary  Patient ID: Dwayne Mcgrath MRN: [id number] DOB/AGE: 1955-05-22 57 y.o.  Admit date: 02/20/2013 Discharge date: 02/25/2013  Admission Diagnoses: Principal Problem:   Umbilical hernia, incarcerated Active Problems:   SBO (small bowel obstruction)   DM (diabetes mellitus)  Discharge Diagnoses:  Principal Problem:   Umbilical hernia, incarcerated s/p open primary repair 02/21/2013 Active Problems:   SBO (small bowel obstruction)   DM (diabetes mellitus)   Discharged Condition: good  Hospital Course: Patient admitted with incarcerated umbilical hernia requiring emergent reduction & repair.  Postoperatively, the patient mobilized in the hallways and advanced to a solid diet gradually.  DM & GERD controlled.  Pain was well-controlled and transitioned off IV medications.    By the time of discharge, the patient was walking well the hallways, eating food well, having flatus.  Pain was-controlled on an oral regimen.  Based on meeting DC criteria and recovering well, I felt it was safe for the patient to be discharged home with close followup.  Instructions were discussed in detail.  They are written as well.     Consults: None  Significant Diagnostic Studies:  Results for orders placed during the hospital encounter of 02/20/13 (from the past 72 hour(s))  GLUCOSE, CAPILLARY     Status: Abnormal   Collection Time    02/22/13 11:59 AM      Result Value Range   Glucose-Capillary 148 (*) 70 - 99 mg/dL   Comment 1 Notify RN     Comment 2 Documented in Chart    GLUCOSE, CAPILLARY     Status: None   Collection Time    02/22/13  5:17 PM      Result Value Range   Glucose-Capillary 99  70 - 99 mg/dL  GLUCOSE, CAPILLARY     Status: Abnormal   Collection Time    02/22/13  9:22 PM      Result Value Range   Glucose-Capillary 127 (*) 70 - 99 mg/dL  GLUCOSE, CAPILLARY     Status: Abnormal   Collection Time    02/23/13  7:25 AM      Result Value Range   Glucose-Capillary 103 (*) 70 - 99 mg/dL   Comment 1 Notify RN     Comment 2 Documented in Chart    GLUCOSE, CAPILLARY     Status: None   Collection Time    02/23/13 11:42 AM      Result Value Range   Glucose-Capillary 93  70 - 99 mg/dL   Comment 1 Notify RN     Comment 2 Documented in Chart    GLUCOSE, CAPILLARY     Status: None   Collection Time    02/23/13  5:53 PM      Result Value Range   Glucose-Capillary 96  70 - 99 mg/dL   Comment 1 Notify RN     Comment 2 Documented in Chart    GLUCOSE, CAPILLARY     Status: None   Collection Time    02/24/13  8:35 AM      Result Value Range   Glucose-Capillary 97  70 - 99 mg/dL   Comment 1 Notify RN     Comment 2 Documented in Chart    GLUCOSE, CAPILLARY     Status: None   Collection Time    02/24/13 11:21 AM      Result Value Range   Glucose-Capillary 92  70 - 99 mg/dL   Comment 1 Documented in Chart  Comment 2 Notify RN    GLUCOSE, CAPILLARY     Status: Abnormal   Collection Time    02/24/13  5:07 PM      Result Value Range   Glucose-Capillary 108 (*) 70 - 99 mg/dL   Comment 1 Documented in Chart     Comment 2 Notify RN    GLUCOSE, CAPILLARY     Status: Abnormal   Collection Time    02/24/13 10:10 PM      Result Value Range   Glucose-Capillary 128 (*) 70 - 99 mg/dL   Comment 1 Documented in Chart     Comment 2 Notify RN    GLUCOSE, CAPILLARY     Status: Abnormal   Collection Time    02/25/13  7:38 AM      Result Value Range   Glucose-Capillary 132 (*) 70 - 99 mg/dL     Treatments: surgery:   Postoperative Diagnosis: Incarcerated umbilical hernia [552.1]  Procedure:  Incarcerated Umbilical Hernia Repair  Surgeon: Glenna Fellows T    Discharge Exam: Blood pressure 135/71, pulse 75, temperature 98.6 F (37 C), temperature source Oral, resp. rate 16, height 5' 10.8" (1.798 m), weight 173 lb 11.6 oz (78.8 kg), SpO2 96.00%.  General: Pt awake/alert/oriented x4 in no major acute distress Eyes: PERRL, normal  EOM. Sclera nonicteric Neuro: CN II-XII intact w/o focal sensory/motor deficits. Lymph: No head/neck/groin lymphadenopathy Psych:  No delerium/psychosis/paranoia.  Talkative.  Interrupts often but pleasant HENT: Normocephalic, Mucus membranes moist.  No thrush Neck: Supple, No tracheal deviation Chest: No pain.  Good respiratory excursion. CV:  Pulses intact.  Regular rhythm MS: Normal AROM mjr joints.  No obvious deformity Abdomen: Soft, Nondistended.  Min tender at periumbilical horiz incision.  Staples intact.  No incarcerated hernias. Ext:  SCDs BLE.  No significant edema.  No cyanosis Skin: No petechiae / purpura   Disposition: 01-Home or Self Care  Discharge Orders   Future Orders Complete By Expires   Call MD for:  extreme fatigue  As directed    Call MD for:  hives  As directed    Call MD for:  persistant nausea and vomiting  As directed    Call MD for:  redness, tenderness, or signs of infection (pain, swelling, redness, odor or green/yellow discharge around incision site)  As directed    Call MD for:  severe uncontrolled pain  As directed    Call MD for:  As directed    Comments:     Temperature > 101.15F   Diet - low sodium heart healthy  As directed    Discharge instructions  As directed    Comments:     Please see discharge instruction sheets.  Also refer to handout given an office.  Please call our office if you have any questions or concerns [phone]   Discharge wound care:  As directed    Comments:     If you have closed incisions, shower and bathe over these incisions with soap and water every day.  You do not need to replace dressings over the closed incisions unless you feel more comfortable with a Band-Aid covering it.   Please call our office [phone] to have the skin staples removed 7-10 days after discharge.  Call if you have further questions.   Driving Restrictions  As directed    Comments:     No driving until off narcotics and can safely swerve  away without pain during an emergency   Increase activity  slowly  As directed    Comments:     Walk an hour a day.  Use 20-30 minute walks.  When you can walk 30 minutes without difficulty, increase to low impact/moderate activities such as biking, jogging, swimming, sexual activity..  Eventually can increase to unrestricted activity when not feeling pain.  If you feel pain: STOP![name]   Let pain protect you from overdoing it.  Use ice/heat/over-the-counter pain medications to help minimize his soreness.  Use pain prescriptions as needed to remain active.  It is better to take extra pain medications and be more active than to stay bedridden to avoid all pain medications.   Lifting restrictions  As directed    Comments:     Avoid heavy lifting initially.  Do not push through pain.  You have no specific weight limit.  Coughing and sneezing or four more stressful to your incision than any lifting you will do. Pain will protect you from injury.  Therefore, avoid intense activity until off all narcotic pain medications.  Coughing and sneezing or four more stressful to your incision than any lifting he will do.   May shower / Bathe  As directed    May walk up steps  As directed    Sexual Activity Restrictions  As directed    Comments:     Sexual activity as tolerated.  Do not push through pain.  Pain will protect you from injury.   Walk with assistance  As directed    Comments:     Walk over an hour a day.  May use a walker/cane/companion to help with balance and stamina.       Medication List         allopurinol 300 MG tablet  Commonly known as:  ZYLOPRIM  Take 300 mg by mouth daily.     benazepril-hydrochlorthiazide 10-12.5 MG per tablet  Commonly known as:  LOTENSIN HCT  Take 1 tablet by mouth daily.     ezetimibe-simvastatin 10-40 MG per tablet  Commonly known as:  VYTORIN  Take 1 tablet by mouth daily.     metFORMIN 1000 MG tablet  Commonly known as:  GLUCOPHAGE  Take 1,000 mg by mouth  daily with breakfast.     oxyCODONE 5 MG immediate release tablet  Commonly known as:  Oxy IR/ROXICODONE  Take 1-2 tablets (5-10 mg total) by mouth every 4 (four) hours as needed for pain.           Follow-up Information   Follow up with HOXWORTH,BENJAMIN T, MD. Schedule an appointment as soon as possible for a visit in 3 weeks.   Specialty:  General Surgery   Contact information:   [address] [place] [address] [phone]       Follow up with Gaspar Garbe, MD. (As needed)    Specialty:  Internal Medicine   Contact information:   [address] [facility] [place] [address] [phone]       Follow up with CCS,MD, MD In 1 week. (to remove staples in incision)    Specialty:  General Surgery   Contact information:   [address] [place] [address] [phone]       Signed: Ardeth Sportsman. 02/25/2013, 9:14 AM

## 2013-02-25 NOTE — Plan of Care (Signed)
Problem: Discharge Progression Outcomes Goal: Staples/sutures removed Outcome: Progressing F/U removal, outpatient at MD office.

## 2013-02-26 ENCOUNTER — Encounter (INDEPENDENT_AMBULATORY_CARE_PROVIDER_SITE_OTHER): Payer: 59 | Admitting: General Surgery

## 2013-02-26 MED ORDER — DEXTROSE 5 % IV SOLN
2.0000 g | INTRAVENOUS | Status: DC | PRN
  Administered 2013-02-20: 2 g via INTRAVENOUS

## 2013-02-26 NOTE — Addendum Note (Signed)
Addendum created 02/26/13 1329 by Lattie Haw, CRNA   Modules edited: Anesthesia Medication Administration

## 2013-03-04 ENCOUNTER — Ambulatory Visit (INDEPENDENT_AMBULATORY_CARE_PROVIDER_SITE_OTHER): Payer: No Typology Code available for payment source

## 2013-03-04 DIAGNOSIS — Z4802 Encounter for removal of sutures: Secondary | ICD-10-CM

## 2013-03-04 NOTE — Progress Notes (Signed)
Pt comes in today for staple removal post umbilical hernia repair.  I examined the incision.  It looks good, completely closed.  I removed all staples and secured with steri strips.  Pt and his wife had questions about FMLA to which I directed them to the check out desk.  Pt has appt on 11/26 with Dr. Johna Sheriff.

## 2013-03-14 ENCOUNTER — Encounter: Payer: Self-pay | Admitting: Family Medicine

## 2013-03-20 ENCOUNTER — Ambulatory Visit (INDEPENDENT_AMBULATORY_CARE_PROVIDER_SITE_OTHER): Payer: No Typology Code available for payment source | Admitting: General Surgery

## 2013-03-20 ENCOUNTER — Encounter (INDEPENDENT_AMBULATORY_CARE_PROVIDER_SITE_OTHER): Payer: Self-pay | Admitting: General Surgery

## 2013-03-20 VITALS — BP 150/84 | HR 71 | Temp 97.0°F | Resp 16 | Ht 70.5 in | Wt 172.6 lb

## 2013-03-20 DIAGNOSIS — Z09 Encounter for follow-up examination after completed treatment for conditions other than malignant neoplasm: Secondary | ICD-10-CM

## 2013-03-20 NOTE — Progress Notes (Signed)
History: Patient returns to the office just over 3 weeks following emergency repair of a large incarcerated umbilical hernia with small bowel obstruction. He had significant bowel edema and a slight serosal injury of the bowel during the procedure and I did not feel comfortable using permanent mesh. He had a 5 cm defect that was closed primarily. He is feeling well at this point. He wants to go back to work. He has no GI complaints.  Exam: BP 150/84  Pulse 71  Temp(Src) 97 F (36.1 C) (Temporal)  Resp 16  Ht 5' 10.5" (1.791 m)  Wt 172 lb 9.6 oz (78.291 kg)  BMI 24.41 kg/m2 General: Appears well Abdomen: Soft and nontender and nondistended. His incision is well-healed. There was no evidence of recurrence or seroma or other complication.  Assessment and plan: Doing well following incarcerated hernia repair as above. I want him to be off work for 6 weeks as he does lifting and bending and twisting at work and he is at some increased risk for recurrence as we were unable to use permanent mesh. He however is doing very well and does not need a return appointment except as needed for any concerns. All of his and his wife's questions were answered.

## 2014-01-21 ENCOUNTER — Encounter: Payer: Self-pay | Admitting: Internal Medicine

## 2014-03-12 ENCOUNTER — Ambulatory Visit (AMBULATORY_SURGERY_CENTER): Payer: Self-pay

## 2014-03-12 VITALS — Ht 70.5 in | Wt 183.2 lb

## 2014-03-12 DIAGNOSIS — Z1211 Encounter for screening for malignant neoplasm of colon: Secondary | ICD-10-CM

## 2014-03-12 MED ORDER — MOVIPREP 100 G PO SOLR: ORAL | Status: DC

## 2014-03-12 NOTE — Progress Notes (Signed)
Per pt, no allergies to soy or egg products.Pt not taking any weight loss meds or using  O2 at home. 

## 2014-03-26 ENCOUNTER — Encounter: Payer: No Typology Code available for payment source | Admitting: Internal Medicine

## 2014-04-16 ENCOUNTER — Encounter: Payer: Self-pay | Admitting: Internal Medicine

## 2014-04-16 ENCOUNTER — Ambulatory Visit (AMBULATORY_SURGERY_CENTER): Payer: No Typology Code available for payment source | Admitting: Internal Medicine

## 2014-04-16 VITALS — BP 153/95 | HR 78 | Temp 98.5°F | Resp 26 | Ht 70.5 in | Wt 183.0 lb

## 2014-04-16 DIAGNOSIS — D122 Benign neoplasm of ascending colon: Secondary | ICD-10-CM

## 2014-04-16 DIAGNOSIS — Z1211 Encounter for screening for malignant neoplasm of colon: Secondary | ICD-10-CM

## 2014-04-16 MED ORDER — FLEET ENEMA 7-19 GM/118ML RE ENEM
1.0000 | ENEMA | Freq: Once | RECTAL | Status: AC
  Administered 2014-04-16: 1 via RECTAL

## 2014-04-16 MED ORDER — SODIUM CHLORIDE 0.9 % IV SOLN: 500.0000 mL | INTRAVENOUS | Status: DC

## 2014-04-16 NOTE — Progress Notes (Signed)
Pt's abdomen distended and he grimaces continually while in the RR. He keeps stating, "It feels like I just have to have a BM."  I continually remind him that there is no stool in colon, only air.  He insists on being put on the bed pan and was able to pass air three times while on it.  He is up to the BR to try to pass air right now.   He is able to pass air, states, "I guess it wasn't stool just air."  I encouraged him to drink warm fluids and ambulate around house if he has feelings of bloating

## 2014-04-16 NOTE — Op Note (Signed)
[place]  [address] [place], [id number]   COLONOSCOPY PROCEDURE REPORT  PATIENT: Dwayne Mcgrath, Dwayne Mcgrath  MR#: [id number] BIRTHDATE: [birth year] , 58  yrs. old GENDER: male ENDOSCOPIST: Eustace Quail, MD REFERRED PR:[id number] Recall, PROCEDURE DATE:  04/16/2014 PROCEDURE:   Colonoscopy with snare polypectomy x 1 First Screening Colonoscopy - Avg.  risk and is 50 yrs.  old or older - No.  Prior Negative Screening - Now for repeat screening. 10 or more years since last screening  History of Adenoma - Now for follow-up colonoscopy & has been > or = to 3 yrs.  N/A  Polyps Removed Today? Yes. ASA CLASS:   Class II INDICATIONS:average risk for colorectal cancer.   Negative index exam 10 yrs ago (07-2004) MEDICATIONS: Monitored anesthesia care and Propofol 300 mg IV  DESCRIPTION OF PROCEDURE:   After the risks benefits and alternatives of the procedure were thoroughly explained, informed consent was obtained.  The digital rectal exam revealed no abnormalities of the rectum.   The LB [id number]  endoscope was introduced through the anus and advanced to the cecum, which was identified by both the appendix and ileocecal valve. No adverse events experienced.   The quality of the prep was adequate, using MoviPrep and pre-exam enemas.  The instrument was then slowly withdrawn as the colon was fully examined.   COLON FINDINGS: A single polyp measuring 2 mm in size was found in the ascending colon.  A polypectomy was performed with a cold snare.  The resection was complete, the polyp tissue was completely retrieved and sent to histology.   The examination was otherwise normal.  Retroflexed views revealed internal hemorrhoids. The time to cecum=3 minutes 59 seconds.  Withdrawal time=10 minutes 04 seconds.  The scope was withdrawn and the procedure completed. COMPLICATIONS: There were no immediate complications.  ENDOSCOPIC IMPRESSION: 1.   Single polyp measuring 2 mm  in size was found in the ascending colon; polypectomy was performed with a cold snare 2.   The examination was otherwise normal  RECOMMENDATIONS: 1. Repeat colonoscopy in 5 years if polyp adenomatous; otherwise 10 years  eSigned:  Eustace Quail, MD 04/16/2014 10:26 AM   cc: The Patient and Domenick Gong, MD

## 2014-04-16 NOTE — Patient Instructions (Signed)
YOU HAD AN ENDOSCOPIC PROCEDURE TODAY AT THE [facility]: Refer to the procedure report that was given to you for any specific questions about what was found during the examination.  If the procedure report does not answer your questions, please call your gastroenterologist to clarify.  If you requested that your care partner not be given the details of your procedure findings, then the procedure report has been included in a sealed envelope for you to review at your convenience later.  YOU SHOULD EXPECT: Some feelings of bloating in the abdomen. Passage of more gas than usual.  Walking can help get rid of the air that was put into your GI tract during the procedure and reduce the bloating. If you had a lower endoscopy (such as a colonoscopy or flexible sigmoidoscopy) you may notice spotting of blood in your stool or on the toilet paper. If you underwent a bowel prep for your procedure, then you may not have a normal bowel movement for a few days.  DIET: Your first meal following the procedure should be a light meal and then it is ok to progress to your normal diet.  A half-sandwich or bowl of soup is an example of a good first meal.  Heavy or fried foods are harder to digest and may make you feel nauseous or bloated.  Likewise meals heavy in dairy and vegetables can cause extra gas to form and this can also increase the bloating.  Drink plenty of fluids but you should avoid alcoholic beverages for 24 hours.  ACTIVITY: Your care partner should take you home directly after the procedure.  You should plan to take it easy, moving slowly for the rest of the day.  You can resume normal activity the day after the procedure however you should NOT DRIVE or use heavy machinery for 24 hours (because of the sedation medicines used during the test).    SYMPTOMS TO REPORT IMMEDIATELY: A gastroenterologist can be reached at any hour.  During normal business hours, 8:30 AM to 5:00 PM Monday through Friday,  call [phone].  After hours and on weekends, please call the GI answering service at [phone] who will take a message and have the physician on call contact you.   Following lower endoscopy (colonoscopy or flexible sigmoidoscopy):  Excessive amounts of blood in the stool  Significant tenderness or worsening of abdominal pains  Swelling of the abdomen that is new, acute  Fever of 100F or higher  FOLLOW UP: If any biopsies were taken you will be contacted by phone or by letter within the next 1-3 weeks.  Call your gastroenterologist if you have not heard about the biopsies in 3 weeks.  Our staff will call the home number listed on your records the next business day following your procedure to check on you and address any questions or concerns that you may have at that time regarding the information given to you following your procedure. This is a courtesy call and so if there is no answer at the home number and we have not heard from you through the emergency physician on call, we will assume that you have returned to your regular daily activities without incident.  SIGNATURES/CONFIDENTIALITY: You and/or your care partner have signed paperwork which will be entered into your electronic medical record.  These signatures attest to the fact that that the information above on your After Visit Summary has been reviewed and is understood.  Full responsibility of the confidentiality of this   discharge information lies with you and/or your care-partner.  Await pathology  Continue your normal medications  Please read over handout about polyps

## 2014-04-16 NOTE — Progress Notes (Signed)
Called to room to assist during endoscopic procedure.  Patient ID and intended procedure confirmed with present staff. Received instructions for my participation in the procedure from the performing physician.  

## 2014-04-16 NOTE — Progress Notes (Signed)
Pt very poor historian, reports eating chicken noodle soup yesterday and that stools are soft mushy brown. Discussed with Dr. Henrene Pastor and he has asked for enemas until clear.

## 2014-04-16 NOTE — Progress Notes (Signed)
Results cloudy yellow after enema.

## 2014-04-21 ENCOUNTER — Telehealth: Payer: Self-pay | Admitting: *Deleted

## 2014-04-21 NOTE — Telephone Encounter (Signed)
Left message on f/u callback 

## 2014-04-23 ENCOUNTER — Encounter: Payer: Self-pay | Admitting: Internal Medicine

## 2014-11-06 IMAGING — CR DG ABDOMEN 2V
3 series · 3 of 3 positions shown · non-contrast
Comparison: None.

CLINICAL DATA: Abdominal pain.

EXAM:
ABDOMEN - 2 VIEW

[AP (1 of 3)]
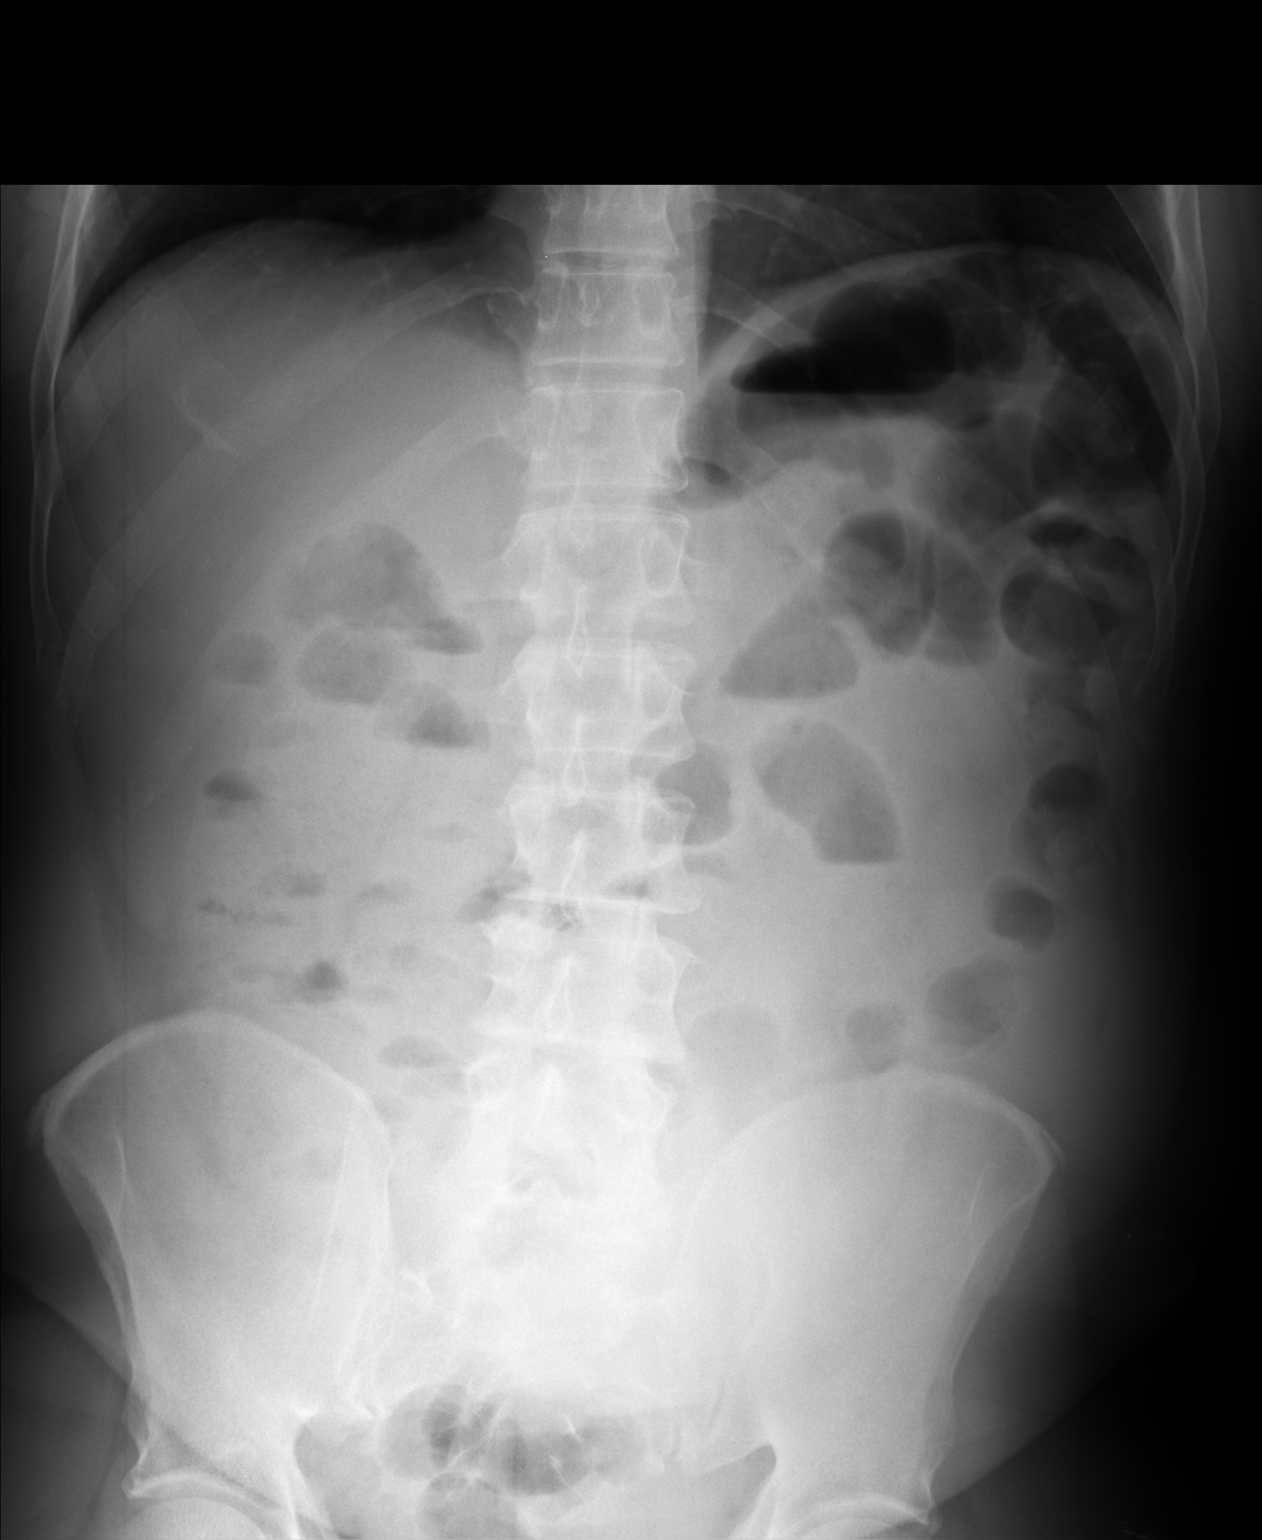

[AP (2 of 3)]
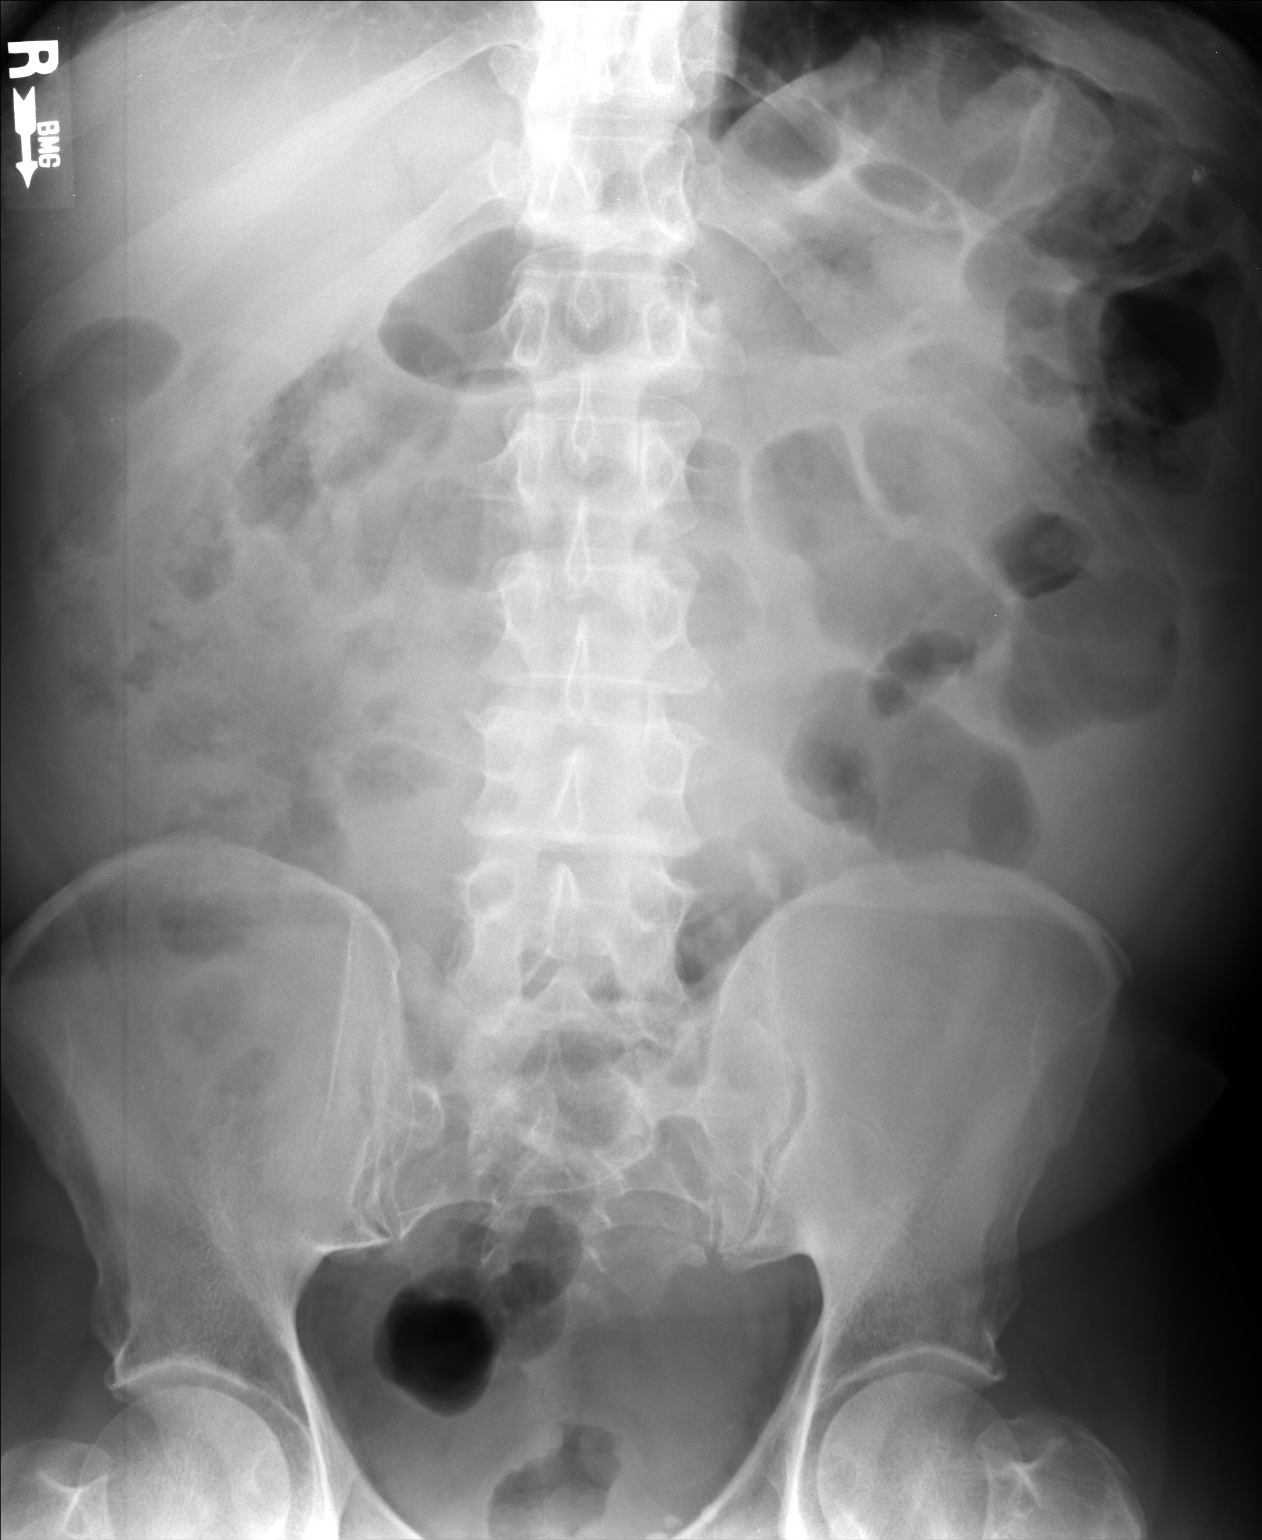

[AP (3 of 3)]
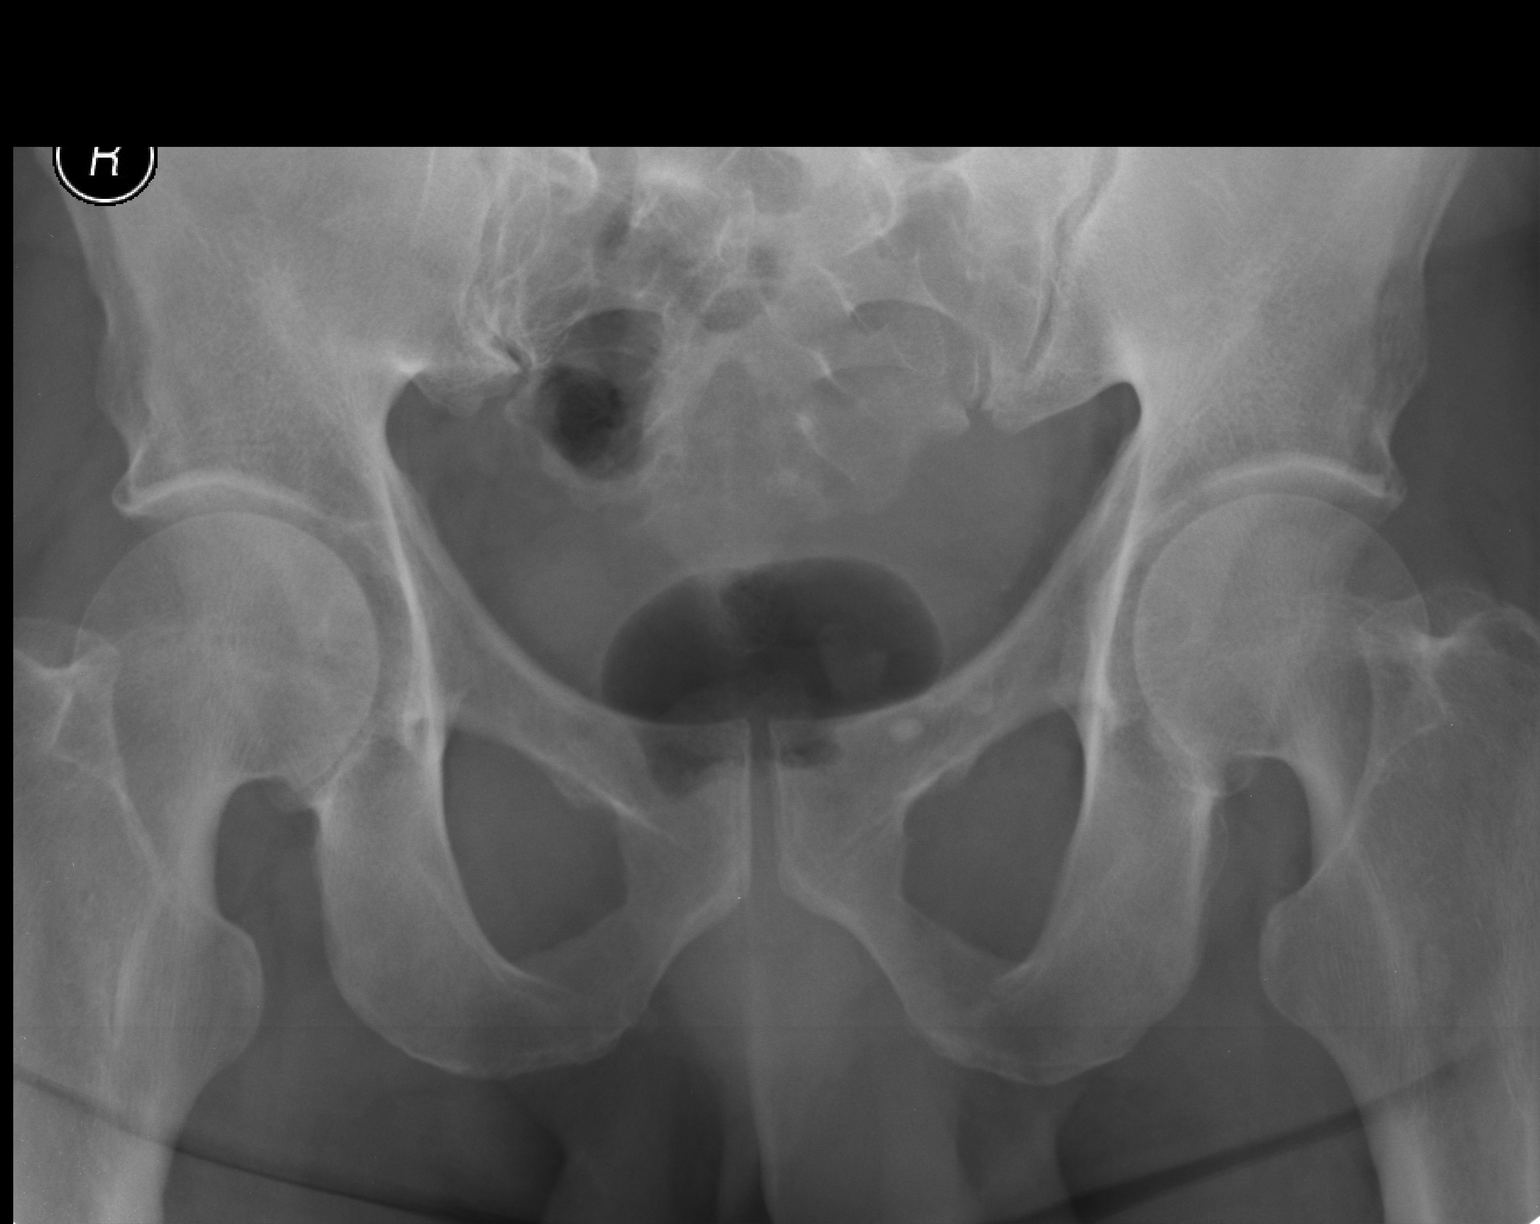

[3 of 3 positions shown; findings below may reference images not displayed]

FINDINGS: Multiple air-fluid levels are seen involving the small bowel
concerning for ileus or distal small bowel obstruction. No
significant colonic dilatation is noted. Air is noted in the rectum.
No abnormal calcifications are noted.
IMPRESSION: Multiple air-fluid levels are seen involving the small bowel
suggesting ileus or distal small bowel obstruction.

## 2016-12-27 DIAGNOSIS — E7800 Pure hypercholesterolemia, unspecified: Secondary | ICD-10-CM | POA: Diagnosis not present

## 2016-12-27 DIAGNOSIS — Z0000 Encounter for general adult medical examination without abnormal findings: Secondary | ICD-10-CM | POA: Diagnosis not present

## 2016-12-27 DIAGNOSIS — E1165 Type 2 diabetes mellitus with hyperglycemia: Secondary | ICD-10-CM | POA: Diagnosis not present

## 2016-12-27 DIAGNOSIS — E119 Type 2 diabetes mellitus without complications: Secondary | ICD-10-CM | POA: Diagnosis not present

## 2016-12-27 DIAGNOSIS — I10 Essential (primary) hypertension: Secondary | ICD-10-CM | POA: Diagnosis not present

## 2016-12-27 DIAGNOSIS — Z125 Encounter for screening for malignant neoplasm of prostate: Secondary | ICD-10-CM | POA: Diagnosis not present

## 2017-06-27 DIAGNOSIS — E119 Type 2 diabetes mellitus without complications: Secondary | ICD-10-CM | POA: Diagnosis not present

## 2017-06-27 DIAGNOSIS — E7800 Pure hypercholesterolemia, unspecified: Secondary | ICD-10-CM | POA: Diagnosis not present

## 2017-06-27 DIAGNOSIS — J302 Other seasonal allergic rhinitis: Secondary | ICD-10-CM | POA: Diagnosis not present

## 2017-06-27 DIAGNOSIS — I10 Essential (primary) hypertension: Secondary | ICD-10-CM | POA: Diagnosis not present

## 2017-11-24 DIAGNOSIS — K080 Exfoliation of teeth due to systemic causes: Secondary | ICD-10-CM | POA: Diagnosis not present

## 2018-01-11 DIAGNOSIS — Z0000 Encounter for general adult medical examination without abnormal findings: Secondary | ICD-10-CM | POA: Diagnosis not present

## 2018-01-11 DIAGNOSIS — E1169 Type 2 diabetes mellitus with other specified complication: Secondary | ICD-10-CM | POA: Diagnosis not present

## 2018-01-11 DIAGNOSIS — J302 Other seasonal allergic rhinitis: Secondary | ICD-10-CM | POA: Diagnosis not present

## 2018-01-11 DIAGNOSIS — Z125 Encounter for screening for malignant neoplasm of prostate: Secondary | ICD-10-CM | POA: Diagnosis not present

## 2018-01-11 DIAGNOSIS — E7800 Pure hypercholesterolemia, unspecified: Secondary | ICD-10-CM | POA: Diagnosis not present

## 2018-01-11 DIAGNOSIS — I10 Essential (primary) hypertension: Secondary | ICD-10-CM | POA: Diagnosis not present

## 2018-01-11 DIAGNOSIS — M109 Gout, unspecified: Secondary | ICD-10-CM | POA: Diagnosis not present

## 2018-01-11 DIAGNOSIS — Z23 Encounter for immunization: Secondary | ICD-10-CM | POA: Diagnosis not present

## 2018-07-12 DIAGNOSIS — I10 Essential (primary) hypertension: Secondary | ICD-10-CM | POA: Diagnosis not present

## 2018-07-12 DIAGNOSIS — E1169 Type 2 diabetes mellitus with other specified complication: Secondary | ICD-10-CM | POA: Diagnosis not present

## 2018-07-12 DIAGNOSIS — E7800 Pure hypercholesterolemia, unspecified: Secondary | ICD-10-CM | POA: Diagnosis not present

## 2019-01-31 DIAGNOSIS — Z0000 Encounter for general adult medical examination without abnormal findings: Secondary | ICD-10-CM | POA: Diagnosis not present

## 2019-01-31 DIAGNOSIS — I10 Essential (primary) hypertension: Secondary | ICD-10-CM | POA: Diagnosis not present

## 2019-01-31 DIAGNOSIS — Z125 Encounter for screening for malignant neoplasm of prostate: Secondary | ICD-10-CM | POA: Diagnosis not present

## 2019-01-31 DIAGNOSIS — E1169 Type 2 diabetes mellitus with other specified complication: Secondary | ICD-10-CM | POA: Diagnosis not present

## 2019-01-31 DIAGNOSIS — M109 Gout, unspecified: Secondary | ICD-10-CM | POA: Diagnosis not present

## 2019-01-31 DIAGNOSIS — Z23 Encounter for immunization: Secondary | ICD-10-CM | POA: Diagnosis not present

## 2019-01-31 DIAGNOSIS — E7800 Pure hypercholesterolemia, unspecified: Secondary | ICD-10-CM | POA: Diagnosis not present

## 2019-03-27 ENCOUNTER — Encounter: Payer: Self-pay | Admitting: Internal Medicine

## 2019-05-06 ENCOUNTER — Encounter: Payer: Self-pay | Admitting: Internal Medicine

## 2019-05-14 DIAGNOSIS — R972 Elevated prostate specific antigen [PSA]: Secondary | ICD-10-CM | POA: Diagnosis not present

## 2019-05-14 DIAGNOSIS — N5201 Erectile dysfunction due to arterial insufficiency: Secondary | ICD-10-CM | POA: Diagnosis not present

## 2019-05-16 ENCOUNTER — Ambulatory Visit (AMBULATORY_SURGERY_CENTER): Payer: Self-pay | Admitting: *Deleted

## 2019-05-16 ENCOUNTER — Other Ambulatory Visit: Payer: Self-pay

## 2019-05-16 VITALS — Temp 96.5°F | Ht 70.5 in | Wt 181.0 lb

## 2019-05-16 DIAGNOSIS — Z86010 Personal history of colonic polyps: Secondary | ICD-10-CM

## 2019-05-16 DIAGNOSIS — Z01818 Encounter for other preprocedural examination: Secondary | ICD-10-CM

## 2019-05-16 MED ORDER — NA SULFATE-K SULFATE-MG SULF 17.5-3.13-1.6 GM/177ML PO SOLN: ORAL | 0 refills | Status: DC

## 2019-05-16 NOTE — Progress Notes (Signed)
Patient is here in-person for PV. Patient denies any allergies to eggs or soy. Patient denies any problems with anesthesia/sedation. Patient denies any oxygen use at home. Patient denies taking any diet/weight loss medications or blood thinners. Patient is not being treated for MRSA or C-diff.   COVID-19 screening test is on 2/1, the pt is aware. Pt is aware that care partner will wait in the car during procedure; if they feel like they will be too hot or cold to wait in the car; they may wait in the 4 th floor lobby. Patient is aware to bring only one care partner. We want them to wear a mask (we do not have any that we can provide them), practice social distancing, and we will check their temperatures when they get here.  I did remind the patient that their care partner needs to stay in the parking lot the entire time and have a cell phone available, we will call them when the pt is ready for discharge. Patient will wear mask into building.    Suprep $15 off coupon given to the patient.

## 2019-05-27 ENCOUNTER — Ambulatory Visit (INDEPENDENT_AMBULATORY_CARE_PROVIDER_SITE_OTHER): Payer: Self-pay

## 2019-05-27 ENCOUNTER — Other Ambulatory Visit: Payer: Self-pay | Admitting: Internal Medicine

## 2019-05-27 DIAGNOSIS — Z1159 Encounter for screening for other viral diseases: Secondary | ICD-10-CM

## 2019-05-27 LAB — SARS CORONAVIRUS 2 (TAT 6-24 HRS): SARS Coronavirus 2: NEGATIVE

## 2019-05-30 ENCOUNTER — Other Ambulatory Visit: Payer: Self-pay

## 2019-05-30 ENCOUNTER — Encounter: Payer: Self-pay | Admitting: Internal Medicine

## 2019-05-30 ENCOUNTER — Ambulatory Visit (AMBULATORY_SURGERY_CENTER): Payer: Federal, State, Local not specified - PPO | Admitting: Internal Medicine

## 2019-05-30 VITALS — BP 135/92 | HR 79 | Temp 96.9°F | Resp 16 | Ht 70.5 in | Wt 181.0 lb

## 2019-05-30 DIAGNOSIS — Z86010 Personal history of colonic polyps: Secondary | ICD-10-CM

## 2019-05-30 DIAGNOSIS — Z1211 Encounter for screening for malignant neoplasm of colon: Secondary | ICD-10-CM | POA: Diagnosis not present

## 2019-05-30 MED ORDER — SODIUM CHLORIDE 0.9 % IV SOLN: 500.0000 mL | Freq: Once | INTRAVENOUS | Status: DC

## 2019-05-30 NOTE — Patient Instructions (Addendum)
YOU HAD AN ENDOSCOPIC PROCEDURE TODAY AT THE [facility]:   Refer to the procedure report that was given to you for any specific questions about what was found during the examination.  If the procedure report does not answer your questions, please call your gastroenterologist to clarify.  If you requested that your care partner not be given the details of your procedure findings, then the procedure report has been included in a sealed envelope for you to review at your convenience later.  YOU SHOULD EXPECT: Some feelings of bloating in the abdomen. Passage of more gas than usual.  Walking can help get rid of the air that was put into your GI tract during the procedure and reduce the bloating. If you had a lower endoscopy (such as a colonoscopy or flexible sigmoidoscopy) you may notice spotting of blood in your stool or on the toilet paper. If you underwent a bowel prep for your procedure, you may not have a normal bowel movement for a few days.  Please Note:  You might notice some irritation and congestion in your nose or some drainage.  This is from the oxygen used during your procedure.  There is no need for concern and it should clear up in a day or so.  SYMPTOMS TO REPORT IMMEDIATELY:   Following lower endoscopy (colonoscopy or flexible sigmoidoscopy):  Excessive amounts of blood in the stool  Significant tenderness or worsening of abdominal pains  Swelling of the abdomen that is new, acute  Fever of 100F or higher  For urgent or emergent issues, a gastroenterologist can be reached at any hour by calling [phone].   DIET:  We do recommend a small meal at first, but then you may proceed to your regular diet.  Drink plenty of fluids but you should avoid alcoholic beverages for 24 hours.  ACTIVITY:  You should plan to take it easy for the rest of today and you should NOT DRIVE or use heavy machinery until tomorrow (because of the sedation medicines used during the test).     FOLLOW UP: Our staff will call the number listed on your records 48-72 hours following your procedure to check on you and address any questions or concerns that you may have regarding the information given to you following your procedure. If we do not reach you, we will leave a message.  We will attempt to reach you two times.  During this call, we will ask if you have developed any symptoms of COVID 19. If you develop any symptoms (ie: fever, flu-like symptoms, shortness of breath, cough etc.) before then, please call [phone].  If you test positive for Covid 19 in the 2 weeks post procedure, please call and report this information to us.    If any biopsies were taken you will be contacted by phone or by letter within the next 1-3 weeks.  Please call us at [phone] if you have not heard about the biopsies in 3 weeks.    SIGNATURES/CONFIDENTIALITY: You and/or your care partner have signed paperwork which will be entered into your electronic medical record.  These signatures attest to the fact that that the information above on your After Visit Summary has been reviewed and is understood.  Full responsibility of the confidentiality of this discharge information lies with you and/or your care-partner. 

## 2019-05-30 NOTE — Progress Notes (Addendum)
Pt's states no medical or surgical changes since previsit or office visit.  Temp taken by LC VS taken by DT 

## 2019-05-30 NOTE — Op Note (Signed)
[facility] Patient Name: Dwayne Mcgrath Procedure Date: 05/30/2019 9:30 AM MRN: [id number] Endoscopist: Docia Chuck. Henrene Pastor , MD Age: 64 Referring MD:  Date of Birth: May 26, 1955 Gender: Male Account #: [id number] Procedure:                Colonoscopy Indications:              High risk colon cancer surveillance: Personal                            history of non-advanced adenoma. Previous                            examinations 2016 and 2015 Medicines:                Monitored Anesthesia Care Procedure:                Pre-Anesthesia Assessment:                           - Prior to the procedure, a History and Physical                            was performed, and patient medications and                            allergies were reviewed. The patient's tolerance of                            previous anesthesia was also reviewed. The risks                            and benefits of the procedure and the sedation                            options and risks were discussed with the patient.                            All questions were answered, and informed consent                            was obtained. Prior Anticoagulants: The patient has                            taken no previous anticoagulant or antiplatelet                            agents. ASA Grade Assessment: II - A patient with                            mild systemic disease. After reviewing the risks                            and benefits, the patient was deemed in  satisfactory condition to undergo the procedure.                           After obtaining informed consent, the colonoscope                            was passed under direct vision. Throughout the                            procedure, the patient's blood pressure, pulse, and                            oxygen saturations were monitored continuously. The                            Colonoscope was introduced through the anus and                            advanced to the the cecum, identified by                            appendiceal orifice and ileocecal valve. The                            ileocecal valve, appendiceal orifice, and rectum                            were photographed. The quality of the bowel                            preparation was adequate to identify polyps. The                            colonoscopy was performed without difficulty. The                            patient tolerated the procedure well. The bowel                            preparation used was SUPREP via split dose                            instruction. Scope In: 9:37:06 AM Scope Out: 9:53:06 AM Scope Withdrawal Time: 0 hours 10 minutes 38 seconds  Total Procedure Duration: 0 hours 16 minutes 0 seconds  Findings:                 The entire examined colon appeared normal on direct                            and retroflexion views. Small internal hemorrhoids                            present. Complications:            No immediate complications. Estimated  blood loss:                            None. Estimated Blood Loss:     Estimated blood loss: none. Impression:               - The entire examined colon is normal on direct and                            retroflexion views.                           - No specimens collected. Recommendation:           - Repeat colonoscopy in 10 years for surveillance.                           - Patient has a contact number available for                            emergencies. The signs and symptoms of potential                            delayed complications were discussed with the                            patient. Return to normal activities tomorrow.                            Written discharge instructions were provided to the                            patient.                           - Resume previous diet.                           - Continue present medications. Docia Chuck. Henrene Pastor,  MD 05/30/2019 9:59:17 AM This report has been signed electronically.

## 2019-05-30 NOTE — Progress Notes (Signed)
Report to PACU, RN, vss, BBS= Clear.  

## 2019-06-03 ENCOUNTER — Telehealth: Payer: Self-pay

## 2019-06-03 NOTE — Telephone Encounter (Signed)
Attempted to reach patient for post-procedure f/u call. No answer. Left message for him to please not hesitate to call us if he has any questions/concerns regarding his care. 

## 2019-06-03 NOTE — Telephone Encounter (Signed)
Attempted to reach patient for post-procedure f/u call. No answer. Left message that we will make another attempt to reach him later today and for him to please not hesitate to call us if he has any questions/concerns regarding his care. 

## 2019-07-09 DIAGNOSIS — C61 Malignant neoplasm of prostate: Secondary | ICD-10-CM | POA: Diagnosis not present

## 2019-07-09 DIAGNOSIS — N4232 Atypical small acinar proliferation of prostate: Secondary | ICD-10-CM | POA: Diagnosis not present

## 2019-07-09 DIAGNOSIS — R972 Elevated prostate specific antigen [PSA]: Secondary | ICD-10-CM | POA: Diagnosis not present

## 2019-07-11 ENCOUNTER — Ambulatory Visit: Payer: Federal, State, Local not specified - PPO | Attending: Family

## 2019-07-11 DIAGNOSIS — Z23 Encounter for immunization: Secondary | ICD-10-CM

## 2019-07-11 NOTE — Progress Notes (Signed)
   Covid-19 Vaccination Clinic  Name:  Dwayne Mcgrath    MRN: [id number] DOB: Oct 04, 1955  07/11/2019  [name] was observed post Covid-19 immunization for 15 minutes without incident. He was provided with Vaccine Information Sheet and instruction to access the V-Safe system.   [name] was instructed to call 911 with any severe reactions post vaccine: [name] Difficulty breathing  . Swelling of face and throat  . A fast heartbeat  . A bad rash all over body  . Dizziness and weakness   Immunizations Administered    Name Date Dose VIS Date Route   Moderna COVID-19 Vaccine 07/11/2019 10:05 AM 0.5 mL 03/26/2019 Intramuscular   Manufacturer: Moderna   Lot: [id number]   [id number]

## 2019-07-19 DIAGNOSIS — C61 Malignant neoplasm of prostate: Secondary | ICD-10-CM | POA: Diagnosis not present

## 2019-08-02 DIAGNOSIS — M109 Gout, unspecified: Secondary | ICD-10-CM | POA: Diagnosis not present

## 2019-08-02 DIAGNOSIS — E1169 Type 2 diabetes mellitus with other specified complication: Secondary | ICD-10-CM | POA: Diagnosis not present

## 2019-08-02 DIAGNOSIS — E7800 Pure hypercholesterolemia, unspecified: Secondary | ICD-10-CM | POA: Diagnosis not present

## 2019-08-02 DIAGNOSIS — I10 Essential (primary) hypertension: Secondary | ICD-10-CM | POA: Diagnosis not present

## 2019-08-13 ENCOUNTER — Ambulatory Visit: Payer: Federal, State, Local not specified - PPO | Attending: Family

## 2019-08-13 DIAGNOSIS — Z23 Encounter for immunization: Secondary | ICD-10-CM

## 2019-08-13 NOTE — Progress Notes (Signed)
   Covid-19 Vaccination Clinic  Name:  Dwayne Mcgrath    MRN: [id number] DOB: 1956/02/07  08/13/2019  Dwayne Mcgrath was observed post Covid-19 immunization for 15 minutes without incident. He was provided with Vaccine Information Sheet and instruction to access the V-Safe system.   Dwayne Mcgrath was instructed to call 911 with any severe reactions post vaccine: [name] Difficulty breathing  . Swelling of face and throat  . A fast heartbeat  . A bad rash all over body  . Dizziness and weakness   Immunizations Administered    Name Date Dose VIS Date Route   Moderna COVID-19 Vaccine 08/13/2019  3:33 PM 0.5 mL 03/2019 Intramuscular   Manufacturer: Moderna   Lot: [id number]   [place][id number]

## 2019-08-23 ENCOUNTER — Encounter: Payer: Self-pay | Admitting: Radiation Oncology

## 2019-08-23 ENCOUNTER — Ambulatory Visit
Admission: RE | Admit: 2019-08-23 | Discharge: 2019-08-23 | Disposition: A | Discharge status: Home / Self Care | Payer: Federal, State, Local not specified - PPO | Source: Ambulatory Visit | Attending: Radiation Oncology | Admitting: Radiation Oncology

## 2019-08-23 ENCOUNTER — Other Ambulatory Visit: Payer: Self-pay

## 2019-08-23 VITALS — BP 151/86 | HR 73 | Temp 98.5°F | Resp 18 | Ht 70.5 in | Wt 178.6 lb

## 2019-08-23 DIAGNOSIS — C61 Malignant neoplasm of prostate: Secondary | ICD-10-CM | POA: Diagnosis not present

## 2019-08-23 DIAGNOSIS — R972 Elevated prostate specific antigen [PSA]: Secondary | ICD-10-CM | POA: Diagnosis not present

## 2019-08-23 HISTORY — DX: Malignant neoplasm of prostate: C61

## 2019-08-23 NOTE — Progress Notes (Signed)
See progress note under physician encounter. 

## 2019-08-23 NOTE — Progress Notes (Signed)
GU Location of Tumor / Histology: prostatic adenocarcinoma  If Prostate Cancer, Gleason Score is (3 + 4) and PSA is (5.7). Prostate volume: 31.4 grams.   Lenon Oms referred by Dr. Delfina Redwood to Dr. Alyson Ingles for further evaluation of an elevated PSA and LUTS.  Biopsies of prostate (if applicable) revealed:   Past/Anticipated interventions by urology, if any: prostate biopsy, referral for consideration of brachytherapy and spaceoar  Past/Anticipated interventions by medical oncology, if any: no  Weight changes, if any: no  Bowel/Bladder complaints, if any: IPSS 10. SHIM 8. Denies dysuria, hematuria, urinary leakage or incontinence. Denies any bowel complaints.    Nausea/Vomiting, if any: no  Pain issues, if any:  no  SAFETY ISSUES:  Prior radiation? no  Pacemaker/ICD? no  Possible current pregnancy? no  Is the patient on methotrexate? 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 No children. Accompanied to by his friend, Ms. Edison Pace.

## 2019-08-23 NOTE — Progress Notes (Signed)
Radiation Oncology         [phone] ________________________________  Initial outpatient Consultation  Name: Dwayne Mcgrath MRN: [id number]  Date: 08/23/2019  DOB: 1955/07/04  [id number], Jori Moll, MD  McKenzie, Candee Furbish, MD   REFERRING PHYSICIAN: Cleon Gustin, MD  DIAGNOSIS: 64 y.o. gentleman with Stage T1c adenocarcinoma of the prostate with Gleason score of 3+4, and PSA of 5.7.    ICD-10-CM   1. Malignant neoplasm of prostate ([place])  C61     HISTORY OF PRESENT ILLNESS: BURNESS Mcgrath is a 64 y.o. male with a diagnosis of prostate cancer. He was noted to have an elevated PSA of 5.7 by his primary care physician, Dr. Delfina Redwood.  Accordingly, he was referred for evaluation in urology by Dr. Alyson Ingles on 05/14/2019,  digital rectal examination was performed at that time revealing no nodules.  The patient proceeded to transrectal ultrasound with 12 biopsies of the prostate on 07/09/2019.  The prostate volume measured 31.4 cc.  Out of 12 core biopsies, 4 were positive.  The maximum Gleason score was 3+4, and this was seen in the right apex. Additionally, Gleason 3+3 was seen in the right apex lateral, right mid lateral, and left apex (with PNI).  The patient reviewed the biopsy results with his urologist and he has kindly been referred today for discussion of potential radiation treatment options.   PREVIOUS RADIATION THERAPY: No  PAST MEDICAL HISTORY:  Past Medical History:  Diagnosis Date  . Diabetes mellitus without complication ([place])   . GERD (gastroesophageal reflux disease)   . Gout   . Hyperlipidemia   . Hypertension   . LVH (left ventricular hypertrophy)    STRESS TEST- NORMAL  . Prostate cancer ([place])   . PSA elevation   . Umbilical hernia, incarcerated s/p open primary repair 02/21/2013 02/25/2013      PAST SURGICAL HISTORY: Past Surgical History:  Procedure Laterality Date  . COLONOSCOPY  04/16/2014  . POLYPECTOMY    . thumb surgery     LEFT THUMB  . UMBILICAL  HERNIA REPAIR N/A 02/20/2013   Procedure: Incarcerated Umbilical Hernia Repair ;  Surgeon: Edward Jolly, MD;  Location: [facility];  Service: General;  Laterality: N/A;  . WISDOM TOOTH EXTRACTION      FAMILY HISTORY:  Family History  Problem Relation Age of Onset  . Diabetes Mother   . Colon cancer Neg Hx   . Esophageal cancer Neg Hx   . Rectal cancer Neg Hx   . Stomach cancer Neg Hx   . Colon polyps Neg Hx   . Breast cancer Neg Hx     SOCIAL HISTORY:  Social History   Socioeconomic History  . Marital status: Single    Spouse name: Not on file  . Number of children: Not on file  . Years of education: Not on file  . Highest education level: Not on file  Occupational History  . Not on file  Tobacco Use  . Smoking status: Never Smoker  . Smokeless tobacco: Never Used  Substance and Sexual Activity  . Alcohol use: Yes    Alcohol/week: 2.0 - 3.0 standard drinks    Types: 2 - 3 Cans of beer per week  . Drug use: Not Currently  . Sexual activity: Yes  Other Topics Concern  . Not on file  Social History Narrative  . Not on file   Social Determinants of Health   Financial Resource Strain:   . Difficulty of Paying Living Expenses:  Food Insecurity:   . Worried About Charity fundraiser in the Last Year:   . Arboriculturist in the Last Year:   Transportation Needs:   . Film/video editor (Medical):   [name] Lack of Transportation (Non-Medical):   Physical Activity:   . Days of Exercise per Week:   . Minutes of Exercise per Session:   Stress:   . Feeling of Stress :   Social Connections:   . Frequency of Communication with Friends and Family:   . Frequency of Social Gatherings with Friends and Family:   . Attends Religious Services:   . Active Member of Clubs or Organizations:   . Attends Archivist Meetings:   [name] Marital Status:   Intimate Partner Violence:   . Fear of Current or Ex-Partner:   . Emotionally Abused:   [name] Physically Abused:   .  Sexually Abused:     ALLERGIES: Patient has no known allergies.  MEDICATIONS:  Current Outpatient Medications  Medication Sig Dispense Refill  . allopurinol (ZYLOPRIM) 300 MG tablet Take 300 mg by mouth daily.     [name] aspirin EC 81 MG tablet Take 81 mg by mouth daily.    . benazepril (LOTENSIN) 10 MG tablet Take 10 mg by mouth daily.    [name] CHERRY PO Take by mouth.    . Ferrous Sulfate (IRON PO) Take by mouth.    [name] GARLIC PO Take by mouth.    . glyBURIDE (DIABETA) 5 MG tablet Take 5 mg by mouth every morning.    . hydrochlorothiazide (HYDRODIURIL) 25 MG tablet     . metFORMIN (GLUCOPHAGE) 1000 MG tablet Take 1,000 mg by mouth daily with breakfast.     . Na Sulfate-K Sulfate-Mg Sulf 17.5-3.13-1.6 GM/177ML SOLN Suprep (no substitutions)-TAKE AS DIRECTED. 354 mL 0  . omeprazole (PRILOSEC) 40 MG capsule Take 40 mg by mouth daily.    . simvastatin (ZOCOR) 20 MG tablet Take 20 mg by mouth daily.    . vitamin C (ASCORBIC ACID) 500 MG tablet Take 500 mg by mouth daily.    [name] VITAMIN D PO Take by mouth.     Current Facility-Administered Medications  Medication Dose Route Frequency Provider Last Rate Last Admin  . 0.9 %  sodium chloride infusion  500 mL Intravenous Once Irene Shipper, MD        REVIEW OF SYSTEMS:  On review of systems, the patient reports that he is doing well overall. He denies any chest pain, shortness of breath, cough, fevers, chills, night sweats, unintended weight changes. He denies any bowel disturbances, and denies abdominal pain, nausea or vomiting. He denies any new musculoskeletal or joint aches or pains. His IPSS was 10, indicating mild-moderate urinary symptoms. His SHIM was 8, indicating he has moderate erectile dysfunction. A complete review of systems is obtained and is otherwise negative.    PHYSICAL EXAM:  Wt Readings from Last 3 Encounters:  08/23/19 178 lb 9.6 oz (81 kg)  05/30/19 181 lb (82.1 kg)  05/16/19 181 lb (82.1 kg)   Temp Readings from Last 3  Encounters:  08/23/19 98.5 F (36.9 C)  05/30/19 (!) 96.9 F (36.1 C)  05/16/19 (!) 96.5 F (35.8 C) (Temporal)   BP Readings from Last 3 Encounters:  08/23/19 (!) 151/86  05/30/19 (!) 135/92  04/16/14 (!) 153/95   Pulse Readings from Last 3 Encounters:  08/23/19 73  05/30/19 79  04/16/14 78   Pain Assessment Pain Score: 0-No pain/10  In general this is a well appearing African American male in no acute distress. He is alert and oriented x4 and appropriate throughout the examination. HEENT reveals that the patient is normocephalic, atraumatic. EOMs are intact. PERRLA. Skin is intact without any evidence of gross lesions. Cardiovascular exam reveals a regular rate and rhythm, no clicks rubs or murmurs are auscultated. Chest is clear to auscultation bilaterally. The abdomen is soft, non tender, non distended. Lower extremities are negative for pretibial pitting edema, deep calf tenderness, cyanosis or clubbing.   KPS = 100  100 - Normal; no complaints; no evidence of disease. 90   - Able to carry on normal activity; minor signs or symptoms of disease. 80   - Normal activity with effort; some signs or symptoms of disease. 30   - Cares for self; unable to carry on normal activity or to do active work. 60   - Requires occasional assistance, but is able to care for most of his personal needs. 50   - Requires considerable assistance and frequent medical care. 14   - Disabled; requires special care and assistance. 5   - Severely disabled; hospital admission is indicated although death not imminent. 44   - Very sick; hospital admission necessary; active supportive treatment necessary. 10   - Moribund; fatal processes progressing rapidly. 0     - Dead  Karnofsky DA, Abelmann Pierz, Craver LS and Burchenal [facility] [phone]) The use of the nitrogen mustards in the palliative treatment of carcinoma: with particular reference to bronchogenic carcinoma Cancer 1 634-56  LABORATORY DATA:  Lab Results   Component Value Date   WBC 10.2 02/21/2013   HGB 13.1 02/21/2013   HCT 37.9 (L) 02/21/2013   MCV 83.8 02/21/2013   PLT 184 02/21/2013   Lab Results  Component Value Date   NA 133 (L) 02/21/2013   K 5.3 (H) 02/21/2013   CL 99 02/21/2013   CO2 23 02/21/2013   Lab Results  Component Value Date   ALT 25 02/20/2013   AST 17 02/20/2013   ALKPHOS 68 02/20/2013   BILITOT 0.8 02/20/2013     RADIOGRAPHY: No results found.    IMPRESSION/PLAN: 1. 64 y.o. gentleman with Stage T1c adenocarcinoma of the prostate with Gleason Score of 3+4, and PSA of 5.7. We discussed the patient's workup and outlined the nature of prostate cancer in this setting. The patient's T stage, Gleason's score, and PSA put him into the favorable intermediate risk group. Accordingly, he is eligible for a variety of potential treatment options including brachytherapy, 5.5 weeks of external radiation or prostatectomy. We discussed the available radiation techniques, and focused on the details and logistics of delivery. We discussed and outlined the risks, benefits, short and long-term effects associated with radiotherapy and compared and contrasted these with prostatectomy. We discussed the role of SpaceOAR in reducing the rectal toxicity associated with radiotherapy. He was encouraged to ask questions that were answered to his stated satisfaction.  At the conclusion of our conversation the patient is interested in moving forward with brachytherapy and use of SpaceOAR to reduce rectal toxicity from radiotherapy.  We will share our discussion with Dr. Alyson Ingles and move forward with scheduling his CT [facility] planning appointment in the near future.  The patient will be contacted by Romie Jumper in our office who will be working closely with him to coordinate OR scheduling and pre and post procedure appointments.  We will contact the pharmaceutical rep to ensure that [place] is available at the  time of procedure.  He will have a  prostate MRI following his post-seed CT SIM to confirm appropriate distribution of the Branch.    Nicholos Johns, PA-C    Tyler Pita, MD  [facility] Direct Dial: [phone]  Fax: [phone] [email or web address]  Skype  [email or web address]   This document serves as a record of services personally performed by Tyler Pita, MD and Freeman Caldron, PA-C. It was created on their behalf by Wilburn Mylar, a trained medical scribe. The creation of this record is based on the scribe's personal observations and the provider's statements to them. This document has been checked and approved by the attending provider.

## 2019-08-28 ENCOUNTER — Telehealth: Payer: Self-pay | Admitting: *Deleted

## 2019-08-28 NOTE — Telephone Encounter (Signed)
CALLED PATIENT TO ASK QUESTIONS, LVM FOR A RETURN CALL 

## 2019-08-29 ENCOUNTER — Encounter: Payer: Self-pay | Admitting: Medical Oncology

## 2019-08-29 NOTE — Progress Notes (Signed)
Left message to introduce myself as the prostate nurse navigator. I was unable to meet him 4/30, when he consulted with Dr. Tammi Klippel. He has chosen brachytherapy as treatment for his prostate cancer. Enid Derry attempted to reach him yesterday but had to leave voicemail. I asked him to call Enid Derry if he has not spoke with her. I gave him my contact information and asked him to call me with questions or concerns.

## 2019-09-09 ENCOUNTER — Telehealth: Payer: Self-pay | Admitting: *Deleted

## 2019-09-09 ENCOUNTER — Telehealth: Payer: Self-pay | Admitting: Radiation Oncology

## 2019-09-09 NOTE — Telephone Encounter (Signed)
Returned patient's phone call, unable to leave message 

## 2019-09-09 NOTE — Telephone Encounter (Signed)
Received voicemail message from patient that he missed a call. Noted that Romie Jumper is attempting to reach patient. Patient explained in his message that he is working and unable to answer. Patient request a detailed message be left on his phone.

## 2019-09-09 NOTE — Telephone Encounter (Signed)
CALLED PATIENT TO ASK QUESTIONS, LVM FOR A RETURN CALL 

## 2019-09-10 ENCOUNTER — Telehealth: Payer: Self-pay | Admitting: *Deleted

## 2019-09-10 ENCOUNTER — Telehealth: Payer: Self-pay | Admitting: Radiation Oncology

## 2019-09-10 NOTE — Telephone Encounter (Signed)
Received voicemail message from patient explaining he missed a call from this office. Noted that Enid Derry continues to try and reach patient via phone. Will inform Enid Derry of patient's attempt to return her call.

## 2019-09-10 NOTE — Telephone Encounter (Signed)
Called patient to ask questions, lvm for a return call

## 2019-09-18 ENCOUNTER — Telehealth: Payer: Self-pay | Admitting: *Deleted

## 2019-09-18 NOTE — Telephone Encounter (Signed)
Returned patient's phone call, spoke with patient 

## 2019-09-24 ENCOUNTER — Telehealth: Payer: Self-pay | Admitting: *Deleted

## 2019-09-24 NOTE — Telephone Encounter (Signed)
CALLED PATIENT TO REMIND OF PRE-SEED APPTS. FOR 09-26-19, LVM FOR A RETURN CALL

## 2019-09-26 ENCOUNTER — Ambulatory Visit
Admission: RE | Admit: 2019-09-26 | Discharge: 2019-09-26 | Disposition: A | Discharge status: Home / Self Care | Payer: Federal, State, Local not specified - PPO | Source: Ambulatory Visit | Attending: Urology | Admitting: Urology

## 2019-09-26 ENCOUNTER — Ambulatory Visit
Admission: RE | Admit: 2019-09-26 | Discharge: 2019-09-26 | Disposition: A | Discharge status: Home / Self Care | Payer: Federal, State, Local not specified - PPO | Source: Ambulatory Visit | Attending: Radiation Oncology | Admitting: Radiation Oncology

## 2019-09-26 ENCOUNTER — Other Ambulatory Visit: Payer: Self-pay | Admitting: Urology

## 2019-09-26 ENCOUNTER — Other Ambulatory Visit: Payer: Self-pay

## 2019-09-26 ENCOUNTER — Encounter: Payer: Self-pay | Admitting: Medical Oncology

## 2019-09-26 DIAGNOSIS — C61 Malignant neoplasm of prostate: Secondary | ICD-10-CM | POA: Diagnosis not present

## 2019-10-01 ENCOUNTER — Other Ambulatory Visit: Payer: Self-pay | Admitting: Urology

## 2019-10-01 ENCOUNTER — Telehealth: Payer: Self-pay | Admitting: *Deleted

## 2019-10-01 NOTE — Progress Notes (Signed)
  Radiation Oncology         [phone] ________________________________  Name: Dwayne Mcgrath MRN: [id number]  Date: 09/26/2019  DOB: 1955/12/12  SIMULATION AND TREATMENT PLANNING NOTE PUBIC ARCH STUDY  LV:DIXVEZ, Jori Moll, MD  Alyson Ingles Candee Furbish, MD  DIAGNOSIS:   Oncology History   No history exists.      ICD-10-CM   1. Malignant neoplasm of prostate ([place])  C61     COMPLEX SIMULATION:  The patient presented today for evaluation for possible prostate seed implant. He was brought to the radiation planning suite and placed supine on the CT couch. A 3-dimensional image study set was obtained in upload to the planning computer. There, on each axial slice, I contoured the prostate gland. Then, using three-dimensional radiation planning tools I reconstructed the prostate in view of the structures from the transperineal needle pathway to assess for possible pubic arch interference. In doing so, I did not appreciate any pubic arch interference. Also, the patient's prostate volume was estimated based on the drawn structure. The volume was 32 cc.  Given the pubic arch appearance and prostate volume, patient remains a good candidate to proceed with prostate seed implant. Today, he freely provided informed written consent to proceed.    PLAN: The patient will undergo prostate seed implant.   ________________________________  Sheral Apley. Tammi Klippel, M.D.

## 2019-10-01 NOTE — Telephone Encounter (Signed)
CALLED PATIENT TO INFORM OF IMPLANT DATE OF 11/15/19, LVM FOR A RETURN CALL

## 2019-10-02 ENCOUNTER — Telehealth: Payer: Self-pay | Admitting: *Deleted

## 2019-10-02 NOTE — Telephone Encounter (Signed)
Called patient to inform of chest x-ray and EKG for 10-04-19 @ 8:30 am @ [facility] Admitting, lvm for a return call

## 2019-10-04 ENCOUNTER — Encounter (HOSPITAL_COMMUNITY)
Admission: RE | Admit: 2019-10-04 | Discharge: 2019-10-04 | Disposition: A | Discharge status: Home / Self Care | Payer: Federal, State, Local not specified - PPO | Source: Ambulatory Visit | Attending: Urology | Admitting: Urology

## 2019-10-04 ENCOUNTER — Ambulatory Visit (HOSPITAL_COMMUNITY)
Admission: RE | Admit: 2019-10-04 | Discharge: 2019-10-04 | Disposition: A | Discharge status: Home / Self Care | Payer: Federal, State, Local not specified - PPO | Source: Ambulatory Visit | Attending: Urology | Admitting: Urology

## 2019-10-04 ENCOUNTER — Other Ambulatory Visit: Payer: Self-pay

## 2019-10-04 DIAGNOSIS — Z01811 Encounter for preprocedural respiratory examination: Secondary | ICD-10-CM | POA: Diagnosis not present

## 2019-10-04 DIAGNOSIS — Z01818 Encounter for other preprocedural examination: Secondary | ICD-10-CM | POA: Diagnosis not present

## 2019-10-04 DIAGNOSIS — J984 Other disorders of lung: Secondary | ICD-10-CM | POA: Diagnosis not present

## 2019-10-10 DIAGNOSIS — C61 Malignant neoplasm of prostate: Secondary | ICD-10-CM | POA: Diagnosis not present

## 2019-11-08 ENCOUNTER — Encounter (HOSPITAL_BASED_OUTPATIENT_CLINIC_OR_DEPARTMENT_OTHER): Payer: Self-pay | Admitting: Urology

## 2019-11-11 ENCOUNTER — Encounter (HOSPITAL_BASED_OUTPATIENT_CLINIC_OR_DEPARTMENT_OTHER): Payer: Self-pay | Admitting: Urology

## 2019-11-11 ENCOUNTER — Other Ambulatory Visit: Payer: Self-pay

## 2019-11-11 ENCOUNTER — Telehealth: Payer: Self-pay | Admitting: *Deleted

## 2019-11-11 NOTE — Progress Notes (Signed)
Spoke w/ via phone for pre-op interview-- PT Lab needs dos----  no             Lab results------ getting CBC, CMP, PT/PTT done 11-12-2019 @ 1530;  Current ekg/ cxr done 10-04-2019 in epic/ chart COVID test ------ 11-14-2019 @ 1500 Arrive at ------- 0530 NPO after MN Medications to take morning of surgery ----- NONE Diabetic medication ----- do not take glyburide/ metformin morning of surgery Patient Special Instructions ----- to do one fleet enema morning of surgery Pre-Op special Istructions ----- n/a Patient verbalized understanding of instructions that were given at this phone interview. Patient denies shortness of breath, chest pain, fever, cough a this phone interview.

## 2019-11-11 NOTE — Telephone Encounter (Signed)
CALLED PATIENT TO REMIND OF LAB AND COVID TESTING FOR 11-12-19, LVM FOR A RETURN CALL

## 2019-11-12 ENCOUNTER — Encounter (HOSPITAL_COMMUNITY)
Admission: RE | Admit: 2019-11-12 | Discharge: 2019-11-12 | Disposition: A | Discharge status: Home / Self Care | Payer: Federal, State, Local not specified - PPO | Source: Ambulatory Visit | Attending: Urology | Admitting: Urology

## 2019-11-12 ENCOUNTER — Other Ambulatory Visit (HOSPITAL_COMMUNITY)
Admission: RE | Admit: 2019-11-12 | Discharge: 2019-11-12 | Disposition: A | Discharge status: Home / Self Care | Payer: Federal, State, Local not specified - PPO | Source: Ambulatory Visit | Attending: Urology | Admitting: Urology

## 2019-11-12 DIAGNOSIS — Z20822 Contact with and (suspected) exposure to covid-19: Secondary | ICD-10-CM | POA: Insufficient documentation

## 2019-11-12 DIAGNOSIS — Z01812 Encounter for preprocedural laboratory examination: Secondary | ICD-10-CM | POA: Insufficient documentation

## 2019-11-12 LAB — CBC
HCT: 37.9 % — ABNORMAL LOW (ref 39.0–52.0)
Hemoglobin: 12.7 g/dL — ABNORMAL LOW (ref 13.0–17.0)
MCH: 29.8 pg (ref 26.0–34.0)
MCHC: 33.5 g/dL (ref 30.0–36.0)
MCV: 89 fL (ref 80.0–100.0)
Platelets: 225 10*3/uL (ref 150–400)
RBC: 4.26 MIL/uL (ref 4.22–5.81)
RDW: 13.1 % (ref 11.5–15.5)
WBC: 4.5 10*3/uL (ref 4.0–10.5)
nRBC: 0 % (ref 0.0–0.2)

## 2019-11-12 LAB — COMPREHENSIVE METABOLIC PANEL
ALT: 19 U/L (ref 0–44)
AST: 19 U/L (ref 15–41)
Albumin: 4.2 g/dL (ref 3.5–5.0)
Alkaline Phosphatase: 47 U/L (ref 38–126)
Anion gap: 10 (ref 5–15)
BUN: 17 mg/dL (ref 8–23)
CO2: 26 mmol/L (ref 22–32)
Calcium: 8.9 mg/dL (ref 8.9–10.3)
Chloride: 104 mmol/L (ref 98–111)
Creatinine, Ser: 1.05 mg/dL (ref 0.61–1.24)
GFR calc Af Amer: >60 mL/min (ref >60)
GFR calc non Af Amer: >60 mL/min (ref >60)
Glucose, Bld: 116 mg/dL — ABNORMAL HIGH (ref 70–99)
Potassium: 5 mmol/L (ref 3.5–5.1)
Sodium: 140 mmol/L (ref 135–145)
Total Bilirubin: 0.3 mg/dL (ref 0.3–1.2)
Total Protein: 7.3 g/dL (ref 6.5–8.1)

## 2019-11-12 LAB — SARS CORONAVIRUS 2 (TAT 6-24 HRS): SARS Coronavirus 2: NEGATIVE

## 2019-11-12 LAB — APTT: aPTT: 29 seconds (ref 24–36)

## 2019-11-12 LAB — PROTIME-INR
INR: 1 (ref 0.8–1.2)
Prothrombin Time: 13 seconds (ref 11.4–15.2)

## 2019-11-14 ENCOUNTER — Telehealth: Payer: Self-pay | Admitting: *Deleted

## 2019-11-14 NOTE — Anesthesia Preprocedure Evaluation (Addendum)
Anesthesia Evaluation  Patient identified by MRN, date of birth, ID band Patient awake    Reviewed: Allergy & Precautions, H&P , NPO status , Patient's Chart, lab work & pertinent test results  Airway Mallampati: II  TM Distance: >3 FB Neck ROM: Full    Dental no notable dental hx. (+) Teeth Intact, Missing, Dental Advisory Given   Pulmonary neg pulmonary ROS,    Pulmonary exam normal breath sounds clear to auscultation       Cardiovascular Exercise Tolerance: Good hypertension, Pt. on medications negative cardio ROS Normal cardiovascular exam Rhythm:Regular Rate:Normal  LVH   Neuro/Psych negative neurological ROS  negative psych ROS   GI/Hepatic negative GI ROS, Neg liver ROS, GERD  ,  Endo/Other  negative endocrine ROSdiabetes, Well Controlled, Type 2, Oral Hypoglycemic Agents  Renal/GU negative Renal ROS  negative genitourinary   Musculoskeletal negative musculoskeletal ROS (+)   Abdominal   Peds negative pediatric ROS (+)  Hematology negative hematology ROS (+)   Anesthesia Other Findings hyponatremia  Reproductive/Obstetrics negative OB ROS                            Anesthesia Physical  Anesthesia Plan  ASA: II  Anesthesia Plan: General   Post-op Pain Management:    Induction: Intravenous  PONV Risk Score and Plan: 2 and Ondansetron  Airway Management Planned: LMA  Additional Equipment:   Intra-op Plan:   Post-operative Plan: Extubation in OR  Informed Consent: I have reviewed the patients History and Physical, chart, labs and discussed the procedure including the risks, benefits and alternatives for the proposed anesthesia with the patient or authorized representative who has indicated his/her understanding and acceptance.     Dental Advisory Given  Plan Discussed with: CRNA and Anesthesiologist  Anesthesia Plan Comments:         Anesthesia Quick  Evaluation

## 2019-11-14 NOTE — Telephone Encounter (Signed)
CALLED PATIENT TO REMIND OF IMPLANT FOR 11-15-19, LVM FOR A RETURN CALL

## 2019-11-15 ENCOUNTER — Ambulatory Visit (HOSPITAL_BASED_OUTPATIENT_CLINIC_OR_DEPARTMENT_OTHER)
Admission: RE | Admit: 2019-11-15 | Discharge: 2019-11-15 | Disposition: A | Discharge status: Home / Self Care | Payer: Federal, State, Local not specified - PPO | Source: Other Acute Inpatient Hospital | Attending: Urology | Admitting: Urology

## 2019-11-15 ENCOUNTER — Ambulatory Visit (HOSPITAL_BASED_OUTPATIENT_CLINIC_OR_DEPARTMENT_OTHER): Payer: Federal, State, Local not specified - PPO | Admitting: Anesthesiology

## 2019-11-15 ENCOUNTER — Encounter (HOSPITAL_BASED_OUTPATIENT_CLINIC_OR_DEPARTMENT_OTHER)
Admission: RE | Disposition: A | Discharge status: Home / Self Care | Payer: Self-pay | Source: Other Acute Inpatient Hospital | Attending: Urology

## 2019-11-15 ENCOUNTER — Encounter (HOSPITAL_BASED_OUTPATIENT_CLINIC_OR_DEPARTMENT_OTHER): Payer: Self-pay | Admitting: Urology

## 2019-11-15 ENCOUNTER — Ambulatory Visit (HOSPITAL_COMMUNITY): Payer: Federal, State, Local not specified - PPO

## 2019-11-15 DIAGNOSIS — E119 Type 2 diabetes mellitus without complications: Secondary | ICD-10-CM | POA: Insufficient documentation

## 2019-11-15 DIAGNOSIS — C61 Malignant neoplasm of prostate: Secondary | ICD-10-CM | POA: Diagnosis not present

## 2019-11-15 DIAGNOSIS — Z833 Family history of diabetes mellitus: Secondary | ICD-10-CM | POA: Insufficient documentation

## 2019-11-15 DIAGNOSIS — Z7984 Long term (current) use of oral hypoglycemic drugs: Secondary | ICD-10-CM | POA: Diagnosis not present

## 2019-11-15 DIAGNOSIS — I10 Essential (primary) hypertension: Secondary | ICD-10-CM | POA: Diagnosis not present

## 2019-11-15 HISTORY — DX: Personal history of adenomatous and serrated colon polyps: Z86.0101

## 2019-11-15 HISTORY — PX: SPACE OAR INSTILLATION: SHX6769

## 2019-11-15 HISTORY — DX: Presence of spectacles and contact lenses: Z97.3

## 2019-11-15 HISTORY — DX: Nocturia: R35.1

## 2019-11-15 HISTORY — DX: Personal history of colonic polyps: Z86.010

## 2019-11-15 HISTORY — DX: Type 2 diabetes mellitus without complications: E11.9

## 2019-11-15 HISTORY — PX: RADIOACTIVE SEED IMPLANT: SHX5150

## 2019-11-15 LAB — GLUCOSE, CAPILLARY
Glucose-Capillary: 129 mg/dL — ABNORMAL HIGH (ref 70–99)
Glucose-Capillary: 114 mg/dL — ABNORMAL HIGH (ref 70–99)

## 2019-11-15 SURGERY — INSERTION, RADIATION SOURCE, PROSTATE
Anesthesia: General | Site: Prostate

## 2019-11-15 MED ORDER — MIDAZOLAM HCL 2 MG/2ML IJ SOLN
INTRAMUSCULAR | Status: DC | PRN
  Administered 2019-11-15: 2 mg via INTRAVENOUS

## 2019-11-15 MED ORDER — STERILE WATER FOR IRRIGATION IR SOLN
Status: DC | PRN
Start: 2019-11-15 — End: 2019-11-15
  Administered 2019-11-15: 3 mL

## 2019-11-15 MED ORDER — TRAMADOL HCL 50 MG PO TABS: 50.0000 mg | ORAL_TABLET | Freq: Four times a day (QID) | ORAL | 0 refills | Status: AC | PRN

## 2019-11-15 MED ORDER — PROPOFOL 10 MG/ML IV BOLUS
INTRAVENOUS | Status: DC | PRN
  Administered 2019-11-15: 20 mg via INTRAVENOUS
  Administered 2019-11-15: 150 mg via INTRAVENOUS
  Administered 2019-11-15: 30 mg via INTRAVENOUS

## 2019-11-15 MED ORDER — FLEET ENEMA 7-19 GM/118ML RE ENEM: 1.0000 | ENEMA | Freq: Once | RECTAL | Status: DC

## 2019-11-15 MED ORDER — FENTANYL CITRATE (PF) 100 MCG/2ML IJ SOLN
INTRAMUSCULAR | Status: AC
  Filled 2019-11-15: qty 2

## 2019-11-15 MED ORDER — OXYCODONE HCL 5 MG PO TABS
ORAL_TABLET | ORAL | Status: AC
  Filled 2019-11-15: qty 1

## 2019-11-15 MED ORDER — CEFAZOLIN SODIUM-DEXTROSE 2-4 GM/100ML-% IV SOLN
INTRAVENOUS | Status: AC
  Filled 2019-11-15: qty 100

## 2019-11-15 MED ORDER — CEFAZOLIN SODIUM-DEXTROSE 2-4 GM/100ML-% IV SOLN
2.0000 g | Freq: Once | INTRAVENOUS | Status: AC
  Administered 2019-11-15: 2 g via INTRAVENOUS

## 2019-11-15 MED ORDER — SODIUM CHLORIDE 0.9 % IV SOLN
INTRAVENOUS | Status: AC | PRN
  Administered 2019-11-15: 1000 mL via INTRAMUSCULAR

## 2019-11-15 MED ORDER — ONDANSETRON HCL 4 MG/2ML IJ SOLN
INTRAMUSCULAR | Status: AC
  Filled 2019-11-15: qty 2

## 2019-11-15 MED ORDER — OXYCODONE HCL 5 MG PO TABS
5.0000 mg | ORAL_TABLET | Freq: Once | ORAL | Status: AC | PRN
  Administered 2019-11-15: 5 mg via ORAL

## 2019-11-15 MED ORDER — FENTANYL CITRATE (PF) 100 MCG/2ML IJ SOLN: 25.0000 ug | INTRAMUSCULAR | Status: DC | PRN

## 2019-11-15 MED ORDER — MEPERIDINE HCL 25 MG/ML IJ SOLN: 6.2500 mg | INTRAMUSCULAR | Status: DC | PRN

## 2019-11-15 MED ORDER — MIDAZOLAM HCL 2 MG/2ML IJ SOLN
INTRAMUSCULAR | Status: AC
  Filled 2019-11-15: qty 2

## 2019-11-15 MED ORDER — LIDOCAINE HCL (CARDIAC) PF 100 MG/5ML IV SOSY
PREFILLED_SYRINGE | INTRAVENOUS | Status: DC | PRN
  Administered 2019-11-15: 80 mg via INTRAVENOUS

## 2019-11-15 MED ORDER — PROPOFOL 10 MG/ML IV BOLUS
INTRAVENOUS | Status: AC
  Filled 2019-11-15: qty 20

## 2019-11-15 MED ORDER — ACETAMINOPHEN 325 MG PO TABS: 325.0000 mg | ORAL_TABLET | ORAL | Status: DC | PRN

## 2019-11-15 MED ORDER — LACTATED RINGERS IV SOLN: INTRAVENOUS | Status: DC

## 2019-11-15 MED ORDER — DEXAMETHASONE SODIUM PHOSPHATE 10 MG/ML IJ SOLN
INTRAMUSCULAR | Status: AC
  Filled 2019-11-15: qty 1

## 2019-11-15 MED ORDER — ACETAMINOPHEN 160 MG/5ML PO SOLN: 325.0000 mg | ORAL | Status: DC | PRN

## 2019-11-15 MED ORDER — IOHEXOL 300 MG/ML  SOLN
INTRAMUSCULAR | Status: DC | PRN
  Administered 2019-11-15: 7 mL

## 2019-11-15 MED ORDER — ONDANSETRON HCL 4 MG/2ML IJ SOLN: 4.0000 mg | Freq: Once | INTRAMUSCULAR | Status: DC | PRN

## 2019-11-15 MED ORDER — OXYCODONE HCL 5 MG/5ML PO SOLN: 5.0000 mg | Freq: Once | ORAL | Status: AC | PRN

## 2019-11-15 MED ORDER — FENTANYL CITRATE (PF) 100 MCG/2ML IJ SOLN
INTRAMUSCULAR | Status: DC | PRN
  Administered 2019-11-15: 25 ug via INTRAVENOUS
  Administered 2019-11-15 (×2): 50 ug via INTRAVENOUS

## 2019-11-15 MED ORDER — DEXAMETHASONE SODIUM PHOSPHATE 10 MG/ML IJ SOLN
INTRAMUSCULAR | Status: DC | PRN
Start: 2019-11-15 — End: 2019-11-15
  Administered 2019-11-15: 8 mg via INTRAVENOUS

## 2019-11-15 MED ORDER — LIDOCAINE 2% (20 MG/ML) 5 ML SYRINGE
INTRAMUSCULAR | Status: AC
  Filled 2019-11-15: qty 5

## 2019-11-15 MED ORDER — ONDANSETRON HCL 4 MG/2ML IJ SOLN
INTRAMUSCULAR | Status: DC | PRN
Start: 2019-11-15 — End: 2019-11-15
  Administered 2019-11-15: 4 mg via INTRAVENOUS

## 2019-11-15 SURGICAL SUPPLY — 39 items
BAG DRN RND TRDRP ANRFLXCHMBR (UROLOGICAL SUPPLIES) ×2
BAG URINE DRAIN 2000ML AR STRL (UROLOGICAL SUPPLIES) ×3 IMPLANT
BLADE CLIPPER SENSICLIP SURGIC (BLADE) ×2 IMPLANT
CATH FOLEY 2WAY SLVR  5CC 16FR (CATHETERS) ×4
CATH FOLEY 2WAY SLVR 5CC 16FR (CATHETERS) ×2 IMPLANT
CATH ROBINSON RED A/P 20FR (CATHETERS) ×2 IMPLANT
CLOTH BEACON ORANGE TIMEOUT ST (SAFETY) ×2 IMPLANT
CNTNR URN SCR LID CUP LEK RST (MISCELLANEOUS) ×2 IMPLANT
CONT SPEC 4OZ STRL OR WHT (MISCELLANEOUS) ×4
COVER BACK TABLE 60X90IN (DRAPES) ×2 IMPLANT
COVER MAYO STAND STRL (DRAPES) ×2 IMPLANT
DRAPE U-SHAPE 47X51 STRL (DRAPES) ×2 IMPLANT
DRSG TEGADERM 4X4.75 (GAUZE/BANDAGES/DRESSINGS) ×3 IMPLANT
DRSG TEGADERM 8X12 (GAUZE/BANDAGES/DRESSINGS) ×3 IMPLANT
GAUZE SPONGE 4X4 12PLY STRL LF (GAUZE/BANDAGES/DRESSINGS) ×1 IMPLANT
GLOVE BIO SURGEON STRL SZ7.5 (GLOVE) IMPLANT
GLOVE BIO SURGEON STRL SZ8 (GLOVE) ×3 IMPLANT
GLOVE BIOGEL PI IND STRL 7.0 (GLOVE) IMPLANT
GLOVE BIOGEL PI INDICATOR 7.0 (GLOVE) ×1
GLOVE SURG ORTHO 8.5 STRL (GLOVE) ×4 IMPLANT
GLOVE SURG SS PI 6.5 STRL IVOR (GLOVE) IMPLANT
GOWN STRL REUS W/TWL LRG LVL3 (GOWN DISPOSABLE) ×2 IMPLANT
GOWN STRL REUS W/TWL XL LVL3 (GOWN DISPOSABLE) ×3 IMPLANT
HOLDER FOLEY CATH W/STRAP (MISCELLANEOUS) ×2 IMPLANT
IMPL SPACEOAR VUE SYSTEM (Spacer) IMPLANT
IMPLANT SPACEOAR VUE SYSTEM (Spacer) ×2 IMPLANT
IV NS 1000ML (IV SOLUTION) ×2
IV NS 1000ML BAXH (IV SOLUTION) ×1 IMPLANT
KIT TURNOVER CYSTO (KITS) ×2 IMPLANT
MANIFOLD NEPTUNE II (INSTRUMENTS) IMPLANT
MARKER SKIN DUAL TIP RULER LAB (MISCELLANEOUS) ×2 IMPLANT
PACK CYSTO (CUSTOM PROCEDURE TRAY) ×2 IMPLANT
Radioactive seeds ×63 IMPLANT
SURGILUBE 2OZ TUBE FLIPTOP (MISCELLANEOUS) ×1 IMPLANT
SYR 10ML LL (SYRINGE) ×4 IMPLANT
TOWEL OR 17X26 10 PK STRL BLUE (TOWEL DISPOSABLE) ×4 IMPLANT
UNDERPAD 30X30 (UNDERPADS AND DIAPERS) ×4 IMPLANT
WATER STERILE IRR 3000ML UROMA (IV SOLUTION) ×1 IMPLANT
WATER STERILE IRR 500ML POUR (IV SOLUTION) ×2 IMPLANT

## 2019-11-15 NOTE — Anesthesia Procedure Notes (Signed)
Procedure Name: LMA Insertion Date/Time: 11/15/2019 7:56 AM Performed by: Raenette Rover, CRNA Pre-anesthesia Checklist: Patient identified, Emergency Drugs available, Suction available and Patient being monitored Patient Re-evaluated:Patient Re-evaluated prior to induction Oxygen Delivery Method: Circle system utilized Preoxygenation: Pre-oxygenation with 100% oxygen Induction Type: IV induction Ventilation: Mask ventilation without difficulty LMA: LMA inserted LMA Size: 4.0 Number of attempts: 1 Placement Confirmation: positive ETCO2 and breath sounds checked- equal and bilateral Tube secured with: Tape Dental Injury: Teeth and Oropharynx as per pre-operative assessment

## 2019-11-15 NOTE — Anesthesia Postprocedure Evaluation (Signed)
Anesthesia Post Note  Patient: Dwayne Mcgrath  Procedure(s) Performed: RADIOACTIVE SEED IMPLANT/BRACHYTHERAPY IMPLANT (N/A Prostate) SPACE OAR INSTILLATION (N/A Prostate)     Patient location during evaluation: PACU Anesthesia Type: General Level of consciousness: awake and alert Pain management: pain level controlled Vital Signs Assessment: post-procedure vital signs reviewed and stable Respiratory status: spontaneous breathing, nonlabored ventilation, respiratory function stable and patient connected to nasal cannula oxygen Cardiovascular status: blood pressure returned to baseline and stable Postop Assessment: no apparent nausea or vomiting Anesthetic complications: no   No complications documented.  Last Vitals:  Vitals:   11/15/19 0930 11/15/19 0945  BP: (!) 126/63 125/83  Pulse: 68 66  Resp: 16 12  Temp:    SpO2: 96% 96%    Last Pain:  Vitals:   11/15/19 1000  TempSrc:   PainSc: 0-No pain                 Yamilett Anastos

## 2019-11-15 NOTE — H&P (Signed)
Urology Admission H&P  Chief Complaint: prostate cancer  History of Present Illness: Mr Shular is a 64yo here for brachytherapy and SpaceOAR for prostate cancer. No significant LUTS. No hematuria  Past Medical History:  Diagnosis Date  . GERD (gastroesophageal reflux disease)   . Gout    followed by pcp   (11-11-2019  last episode > 4 yrs ago)  . History of adenomatous polyp of colon   . Hyperlipidemia   . Hypertension    followed by pcp  . Nocturia   . Prostate cancer [facility]) urologist--- dr Alyson Ingles  oncologist--- dr Tammi Klippel   dx 03/ 2021   Stage T1c, Gleason 3+4  . Type 2 diabetes mellitus ([place])    followed by pcp   (11-11-2019 per pt does not check blood sugar at home on regular basis)  . Wears glasses    Past Surgical History:  Procedure Laterality Date  . COLONOSCOPY  last one 05-30-2019  dr Henrene Pastor  . FINGER SURGERY Left yrs ago   repair complex laceration left thumb  . UMBILICAL HERNIA REPAIR N/A 02/20/2013   Procedure: Incarcerated Umbilical Hernia Repair ;  Surgeon: Edward Jolly, MD;  Location: [facility] ORS;  Service: General;  Laterality: N/A;  . WISDOM TOOTH EXTRACTION      Home Medications:  Current Facility-Administered Medications  Medication Dose Route Frequency Provider Last Rate Last Admin  . 0.9 %  sodium chloride infusion    Continuous PRN Cleon Gustin, MD   1,000 mL at 11/15/19 0736  . ceFAZolin (ANCEF) IVPB 2g/100 mL premix  2 g Intravenous Once Cleon Gustin, MD      . iohexol (OMNIPAQUE) 300 MG/ML solution    PRN Cleon Gustin, MD   7 mL at 11/15/19 0736  . lactated ringers infusion   Intravenous Continuous Barnet Glasgow, MD 50 mL/hr at 11/15/19 0654 Continued from Pre-op at 11/15/19 0654  . [START ON 11/16/2019] sodium phosphate (FLEET) 7-19 GM/118ML enema 1 enema  1 enema Rectal Once Qasim Diveley, Candee Furbish, MD      . sterile water for irrigation for irrigation    PRN Alyson Ingles Candee Furbish, MD   3 mL at 11/15/19 5027   Allergies: No  Known Allergies  Family History  Problem Relation Age of Onset  . Diabetes Mother   . Colon cancer Neg Hx   . Esophageal cancer Neg Hx   . Rectal cancer Neg Hx   . Stomach cancer Neg Hx   . Colon polyps Neg Hx   . Breast cancer Neg Hx    Social History:  reports that he has never smoked. He has never used smokeless tobacco. He reports current alcohol use of about 2.0 - 3.0 standard drinks of alcohol per week. He reports previous drug use.  Review of Systems  All other systems reviewed and are negative.   Physical Exam:  Vital signs in last 24 hours: Temp:  [97.8 F (36.6 C)] 97.8 F (36.6 C) (07/23 0612) Pulse Rate:  [62] 62 (07/23 0612) Resp:  [15] 15 (07/23 0612) BP: (143)/(87) 143/87 (07/23 0612) SpO2:  [100 %] 100 % (07/23 0612) Weight:  [82.6 kg] 82.6 kg (07/23 0612) Physical Exam Constitutional:      Appearance: Normal appearance.  HENT:     Head: Normocephalic and atraumatic.  Eyes:     Extraocular Movements: Extraocular movements intact.     Pupils: Pupils are equal, round, and reactive to light.  Cardiovascular:     Rate and Rhythm:  Normal rate and regular rhythm.  Pulmonary:     Effort: Pulmonary effort is normal. No respiratory distress.  Abdominal:     General: Abdomen is flat. There is no distension.  Musculoskeletal:        General: Normal range of motion.     Cervical back: Normal range of motion and neck supple.  Skin:    General: Skin is warm and dry.  Neurological:     General: No focal deficit present.     Mental Status: He is alert and oriented to person, place, and time.  Psychiatric:        Mood and Affect: Mood normal.        Behavior: Behavior normal.        Thought Content: Thought content normal.        Judgment: Judgment normal.     Laboratory Data:  Results for orders placed or performed during the hospital encounter of 11/15/19 (from the past 24 hour(s))  Glucose, capillary     Status: Abnormal   Collection Time: 11/15/19  6:24  AM  Result Value Ref Range   Glucose-Capillary 129 (H) 70 - 99 mg/dL   Recent Results (from the past 240 hour(s))  SARS CORONAVIRUS 2 (TAT 6-24 HRS) Nasopharyngeal Nasopharyngeal Swab     Status: None   Collection Time: 11/12/19  3:25 PM   Specimen: Nasopharyngeal Swab  Result Value Ref Range Status   SARS Coronavirus 2 NEGATIVE NEGATIVE Final    Comment: (NOTE) SARS-CoV-2 target nucleic acids are NOT DETECTED.  The SARS-CoV-2 RNA is generally detectable in upper and lower respiratory specimens during the acute phase of infection. Negative results do not preclude SARS-CoV-2 infection, do not rule out co-infections with other pathogens, and should not be used as the sole basis for treatment or other patient management decisions. Negative results must be combined with clinical observations, patient history, and epidemiological information. The expected result is Negative.  Fact Sheet for Patients: SugarRoll.be  Fact Sheet for Healthcare Providers: https://www.woods-mathews.com/  This test is not yet approved or cleared by the [place] FDA and  has been authorized for detection and/or diagnosis of SARS-CoV-2 by FDA under an Emergency Use Authorization (EUA). This EUA will remain  in effect (meaning this test can be used) for the duration of the COVID-19 declaration under Se ction 564(b)(1) of the Act, 21 U.S.C. section 360bbb-3(b)(1), unless the authorization is terminated or revoked sooner.  Performed at [facility], [place] [address] [place], [place] [address]    Creatinine: Recent Labs    11/12/19 1548  CREATININE 1.05   Baseline Creatinine: 1  Impression/Assessment:  64yo with prostate cancer  Plan:  The risks/benefits/alternatives to brachytherapy with SpaceOAR was explained to the patient and he understands and wishes to proceed with surgery  Nicolette Bang 11/15/2019, 7:41 AM

## 2019-11-15 NOTE — Transfer of Care (Signed)
Immediate Anesthesia Transfer of Care Note  Patient: Dwayne Mcgrath  Procedure(s) Performed: RADIOACTIVE SEED IMPLANT/BRACHYTHERAPY IMPLANT (N/A Prostate) SPACE OAR INSTILLATION (N/A Prostate)  Patient Location: PACU  Anesthesia Type:General  Level of Consciousness: drowsy and patient cooperative  Airway & Oxygen Therapy: Patient Spontanous Breathing and Patient connected to nasal cannula oxygen  Post-op Assessment: Report given to RN and Post -op Vital signs reviewed and stable  Post vital signs: Reviewed and stable  Last Vitals:  Vitals Value Taken Time  BP 131/84 11/15/19 0903  Temp    Pulse 71 11/15/19 0904  Resp 11 11/15/19 0904  SpO2 100 % 11/15/19 0904  Vitals shown include unvalidated device data.  Last Pain:  Vitals:   11/15/19 0612  TempSrc: Oral  PainSc: 0-No pain      Patients Stated Pain Goal: 5 (73/22/56 7209)  Complications: No complications documented.

## 2019-11-15 NOTE — Discharge Instructions (Signed)
Indwelling Urinary Catheter Care, Adult An indwelling urinary catheter is a thin tube that is put into your bladder. The tube helps to drain pee (urine) out of your body. The tube goes in through your urethra. Your urethra is where pee comes out of your body. Your pee will come out through the catheter, then it will go into a bag (drainage bag). Take good care of your catheter so it will work well. How to wear your catheter and bag Supplies needed  Sticky tape (adhesive tape) or a leg strap.  Alcohol wipe or soap and water (if you use tape).  A clean towel (if you use tape).  Large overnight bag.  Smaller bag (leg bag). Wearing your catheter Attach your catheter to your leg with tape or a leg strap.  Make sure the catheter is not pulled tight.  If a leg strap gets wet, take it off and put on a dry strap.  If you use tape to hold the bag on your leg: 1. Use an alcohol wipe or soap and water to wash your skin where the tape made it sticky before. 2. Use a clean towel to pat-dry that skin. 3. Use new tape to make the bag stay on your leg. Wearing your bags You should have been given a large overnight bag.  You may wear the overnight bag in the day or night.  Always have the overnight bag lower than your bladder.  Do not let the bag touch the floor.  Before you go to sleep, put a clean plastic bag in a wastebasket. Then hang the overnight bag inside the wastebasket. You should also have a smaller leg bag that fits under your clothes.  Always wear the leg bag below your knee.  Do not wear your leg bag at night. How to care for your skin and catheter Supplies needed  A clean washcloth.  Water and mild soap.  A clean towel. Caring for your skin and catheter      Clean the skin around your catheter every day: 1. Wash your hands with soap and water. 2. Wet a clean washcloth in warm water and mild soap. 3. Clean the skin around your urethra.  If you are  male:  Gently spread the folds of skin around your vagina (labia).  With the washcloth in your other hand, wipe the inner side of your labia on each side. Wipe from front to back.  If you are male:  Pull back any skin that covers the end of your penis (foreskin).  With the washcloth in your other hand, wipe your penis in small circles. Start wiping at the tip of your penis, then move away from the catheter.  Move the foreskin back in place, if needed. 4. With your free hand, hold the catheter close to where it goes into your body.  Keep holding the catheter during cleaning so it does not get pulled out. 5. With the washcloth in your other hand, clean the catheter.  Only wipe downward on the catheter.  Do not wipe upward toward your body. Doing this may push germs into your urethra and cause infection. 6. Use a clean towel to pat-dry the catheter and the skin around it. Make sure to wipe off all soap. 7. Wash your hands with soap and water.  Shower every day. Do not take baths.  Do not use cream, ointment, or lotion on the area where the catheter goes into your body, unless your doctor tells you   to.  Do not use powders, sprays, or lotions on your genital area.  Check your skin around the catheter every day for signs of infection. Check for: ? Redness, swelling, or pain. ? Fluid or blood. ? Warmth. ? Pus or a bad smell. How to empty the bag Supplies needed  Rubbing alcohol.  Gauze pad or cotton ball.  Tape or a leg strap. Emptying the bag Pour the pee out of your bag when it is ?- full, or at least 2-3 times a day. Do this for your overnight bag and your leg bag. 1. Wash your hands with soap and water. 2. Separate (detach) the bag from your leg. 3. Hold the bag over the toilet or a clean pail. Keep the bag lower than your hips and bladder. This is so the pee (urine) does not go back into the tube. 4. Open the pour spout. It is at the bottom of the bag. 5. Empty the  pee into the toilet or pail. Do not let the pour spout touch any surface. 6. Put rubbing alcohol on a gauze pad or cotton ball. 7. Use the gauze pad or cotton ball to clean the pour spout. 8. Close the pour spout. 9. Attach the bag to your leg with tape or a leg strap. 10. Wash your hands with soap and water. Follow instructions for cleaning the drainage bag:  From the product maker.  As told by your doctor. How to change the bag Supplies needed  Alcohol wipes.  A clean bag.  Tape or a leg strap. Changing the bag Replace your bag when it starts to leak, smell bad, or look dirty. 1. Wash your hands with soap and water. 2. Separate the dirty bag from your leg. 3. Pinch the catheter with your fingers so that pee does not spill out. 4. Separate the catheter tube from the bag tube where these tubes connect (at the connection valve). Do not let the tubes touch any surface. 5. Clean the end of the catheter tube with an alcohol wipe. Use a different alcohol wipe to clean the end of the bag tube. 6. Connect the catheter tube to the tube of the clean bag. 7. Attach the clean bag to your leg with tape or a leg strap. Do not make the bag tight on your leg. 8. Wash your hands with soap and water. General rules   Never pull on your catheter. Never try to take it out. Doing that can hurt you.  Always wash your hands before and after you touch your catheter or bag. Use a mild, fragrance-free soap. If you do not have soap and water, use hand sanitizer.  Always make sure there are no twists or bends (kinks) in the catheter tube.  Always make sure there are no leaks in the catheter or bag.  Drink enough fluid to keep your pee pale yellow.  Do not take baths, swim, or use a hot tub.  If you are male, wipe from front to back after you poop (have a bowel movement). Contact a doctor if:  Your pee is cloudy.  Your pee smells worse than usual.  Your catheter gets clogged.  Your catheter  leaks.  Your bladder feels full. Get help right away if:  You have redness, swelling, or pain where the catheter goes into your body.  You have fluid, blood, pus, or a bad smell coming from the area where the catheter goes into your body.  Your skin feels warm where   where the catheter goes into your body.  You have a fever.  You have pain in your: ? Belly (abdomen). ? Legs. ? Lower back. ? Bladder.  You see blood in the catheter.  Your pee is pink or red.  You feel sick to your stomach (nauseous).  You throw up (vomit).  You have chills.  Your pee is not draining into the bag.  Your catheter gets pulled out. Summary  An indwelling urinary catheter is a thin tube that is placed into the bladder to help drain pee (urine) out of the body.  The catheter is placed into the part of the body that drains pee from the bladder (urethra).  Taking good care of your catheter will keep it working properly and help prevent problems.  Always wash your hands before and after touching your catheter or bag.  Never pull on your catheter or try to take it out. This information is not intended to replace advice given to you by your health care provider. Make sure you discuss any questions you have with your health care provider. Document Revised: 08/03/2018 Document Reviewed: 11/25/2016 Elsevier Patient Education  2020 [employer] Inc.   Post Anesthesia Home Care Instructions  Activity: Get plenty of rest for the remainder of the day. A responsible adult should stay with you for 24 hours following the procedure.  For the next 24 hours, DO NOT: -Drive a car -Operate machinery -Drink alcoholic beverages -Take any medication unless instructed by your physician -Make any legal decisions or sign important papers.  Meals: Start with liquid foods such as gelatin or soup. Progress to regular foods as tolerated. Avoid greasy, spicy, heavy foods. If nausea and/or vomiting occur, drink only clear  liquids until the nausea and/or vomiting subsides. Call your physician if vomiting continues.  Special Instructions/Symptoms: Your throat may feel dry or sore from the anesthesia or the breathing tube placed in your throat during surgery. If this causes discomfort, gargle with warm salt water. The discomfort should disappear within 24 hours.  If you had a scopolamine patch placed behind your ear for the management of post- operative nausea and/or vomiting:  1. The medication in the patch is effective for 72 hours, after which it should be removed.  Wrap patch in a tissue and discard in the trash. Wash hands thoroughly with soap and water. 2. You may remove the patch earlier than 72 hours if you experience unpleasant side effects which may include dry mouth, dizziness or visual disturbances. 3. Avoid touching the patch. Wash your hands with soap and water after contact with the patch.    

## 2019-11-15 NOTE — Op Note (Signed)
PRE-OPERATIVE DIAGNOSIS:  Adenocarcinoma of the prostate  POST-OPERATIVE DIAGNOSIS:  Same  PROCEDURE:  Procedure(s): 1. I-125 radioactive seed implantation 2. Cystoscopy 3. Placement of SpaceOAR  SURGEON:  Surgeon(s): Nicolette Bang, MD  Radiation oncologist: Dr. Tyler Pita  ANESTHESIA:  General  EBL:  Minimal  DRAINS: 37 French Foley catheter  INDICATION: Dwayne Mcgrath is a 64 year old with a history of T1c prostate cancer. After discussing treatment options he has elected to proceed with brachytherapy  Description of procedure: After informed consent the patient was brought to the major OR, placed on the table and administered general anesthesia. He was then moved to the modified lithotomy position with his perineum perpendicular to the floor. His perineum and genitalia were then sterilely prepped. An official timeout was then performed. A 16 French Foley catheter was then placed in the bladder and filled with dilute contrast, a rectal tube was placed in the rectum and the transrectal ultrasound probe was placed in the rectum and affixed to the stand. He was then sterilely draped.  Real time ultrasonography was used along with the seed planning software [employer] Prostate vs. 4.2.21. This was used to develop the seed plan including the number of needles as well as number of seeds required for complete and adequate coverage. Real-time ultrasonography was then used along with the previously developed plan and the Nucletron device to implant a total of 63 seeds using 20 needles. This proceeded without difficulty or complication.  We then proceeded to mix the SpaceOAR using the kit supplied from the manufacturer. Once this was complete we placed a sinal needle into the perirectal fat between the rectum and the prostate. Once this was accomplished we injected 2cc of normal saline to hydrodissect the plain. We then instilled the the SpaceOAR through the spinal needle and noted good  distribution in the perirectal fat.    A Foley catheter was then removed as well as the transrectal ultrasound probe and rectal probe. Flexible cystoscopy was then performed using the 17 French flexible scope which revealed a normal urethra throughout its length down to the sphincter which appeared intact. The prostatic urethra revealed bilobar hypertrophy but no evidence of obstruction, seeds, spacers or lesions. The bladder was then entered and fully and systematically inspected. The ureteral orifices were noted to be of normal configuration and position. The mucosa revealed no evidence of tumors. There were also no stones identified within the bladder. I noted no seeds or spacers on the floor of the bladder and retroflexion of the scope revealed no seeds protruding from the base of the prostate.  The cystoscope was then removed and a new 60 French Foley catheter was then inserted and the balloon was filled with 10 cc of sterile water. This was connected to closed system drainage and the patient was awakened and taken to recovery room in stable and satisfactory condition. He tolerated procedure well and there were no intraoperative complications.

## 2019-11-17 NOTE — Progress Notes (Signed)
  Radiation Oncology         [phone] ________________________________  Name: Dwayne Mcgrath MRN: [id number]  Date: 11/17/2019  DOB: 04/14/1956       Prostate Seed Implant  [id number], Jori Moll, MD  No ref. provider found  DIAGNOSIS:  Oncology History   No history exists.    No diagnosis found.  PROCEDURE: Insertion of radioactive I-125 seeds into the prostate gland.  RADIATION DOSE: 145 Gy, definitive therapy.  TECHNIQUE: CONG HIGHTOWER was brought to the operating room with the urologist. He was placed in the dorsolithotomy position. He was catheterized and a rectal tube was inserted. The perineum was shaved, prepped and draped. The ultrasound probe was then introduced into the rectum to see the prostate gland.  TREATMENT DEVICE: A needle grid was attached to the ultrasound probe stand and anchor needles were placed.  3D PLANNING: The prostate was imaged in 3D using a sagittal sweep of the prostate probe. These images were transferred to the planning computer. There, the prostate, urethra and rectum were defined on each axial reconstructed image. Then, the software created an optimized 3D plan and a few seed positions were adjusted. The quality of the plan was reviewed using [facility] information for the target and the following two organs at risk:  Urethra and Rectum.  Then the accepted plan was printed and handed off to the radiation therapist.  Under my supervision, the custom loading of the seeds and spacers was carried out and loaded into sealed vicryl sleeves.  These pre-loaded needles were then placed into the needle holder.[name]  PROSTATE VOLUME STUDY:  Using transrectal ultrasound the volume of the prostate was verified to be 27.8 cc.  SPECIAL TREATMENT PROCEDURE/SUPERVISION AND HANDLING: The pre-loaded needles were then delivered under sagittal guidance. A total of 20 needles were used to deposit 63 seeds in the prostate gland. The individual seed activity was 0.411 mCi.  SpaceOAR:   Yes  COMPLEX SIMULATION: At the end of the procedure, an anterior radiograph of the pelvis was obtained to document seed positioning and count. Cystoscopy was performed to check the urethra and bladder.  MICRODOSIMETRY: At the end of the procedure, the patient was emitting 0.145 mR/hr at 1 meter. Accordingly, he was considered safe for hospital discharge.  PLAN: The patient will return to the radiation oncology clinic for post implant CT dosimetry in three weeks.   ________________________________  Sheral Apley Tammi Klippel, M.D.

## 2019-11-18 ENCOUNTER — Encounter (HOSPITAL_BASED_OUTPATIENT_CLINIC_OR_DEPARTMENT_OTHER): Payer: Self-pay | Admitting: Urology

## 2019-11-22 DIAGNOSIS — C61 Malignant neoplasm of prostate: Secondary | ICD-10-CM | POA: Diagnosis not present

## 2019-12-04 ENCOUNTER — Telehealth: Payer: Self-pay | Admitting: *Deleted

## 2019-12-04 NOTE — Telephone Encounter (Signed)
CALLED PATIENT TO REMIND OF POST SEED APPTS. FOR 12-05-19, LVM FOR A RETURN CALL

## 2019-12-05 ENCOUNTER — Other Ambulatory Visit: Payer: Self-pay

## 2019-12-05 ENCOUNTER — Other Ambulatory Visit: Payer: Self-pay | Admitting: Urology

## 2019-12-05 ENCOUNTER — Encounter: Payer: Self-pay | Admitting: Urology

## 2019-12-05 ENCOUNTER — Ambulatory Visit
Admission: RE | Admit: 2019-12-05 | Discharge: 2019-12-05 | Disposition: A | Discharge status: Home / Self Care | Payer: Federal, State, Local not specified - PPO | Source: Ambulatory Visit | Attending: Urology | Admitting: Urology

## 2019-12-05 ENCOUNTER — Ambulatory Visit
Admission: RE | Admit: 2019-12-05 | Discharge: 2019-12-05 | Disposition: A | Discharge status: Home / Self Care | Payer: Federal, State, Local not specified - PPO | Source: Ambulatory Visit | Attending: Radiation Oncology | Admitting: Radiation Oncology

## 2019-12-05 ENCOUNTER — Encounter: Payer: Self-pay | Admitting: Medical Oncology

## 2019-12-05 VITALS — BP 137/87 | HR 86 | Temp 98.2°F | Resp 20

## 2019-12-05 DIAGNOSIS — Z510 Encounter for antineoplastic radiation therapy: Secondary | ICD-10-CM | POA: Insufficient documentation

## 2019-12-05 DIAGNOSIS — R350 Frequency of micturition: Secondary | ICD-10-CM | POA: Diagnosis not present

## 2019-12-05 DIAGNOSIS — Z79899 Other long term (current) drug therapy: Secondary | ICD-10-CM | POA: Insufficient documentation

## 2019-12-05 DIAGNOSIS — C61 Malignant neoplasm of prostate: Secondary | ICD-10-CM | POA: Insufficient documentation

## 2019-12-05 DIAGNOSIS — Z923 Personal history of irradiation: Secondary | ICD-10-CM | POA: Insufficient documentation

## 2019-12-05 DIAGNOSIS — Z7982 Long term (current) use of aspirin: Secondary | ICD-10-CM | POA: Insufficient documentation

## 2019-12-05 DIAGNOSIS — R3911 Hesitancy of micturition: Secondary | ICD-10-CM | POA: Diagnosis not present

## 2019-12-05 MED ORDER — TAMSULOSIN HCL 0.4 MG PO CAPS: 0.4000 mg | ORAL_CAPSULE | Freq: Every day | ORAL | 2 refills | Status: AC

## 2019-12-05 NOTE — Progress Notes (Signed)
Dwayne Mcgrath states he is doing well post seed implant except for urinary frequency and not feeling he is emptying bladder well. He did not have this at first but the frequency has been worse  the past few days. I explained there are medications that can be prescribed, to help him empty his bladder completely and help with frequency. He has follow up with Ashlyn, PA following CT simulation and he will discuss with her.

## 2019-12-05 NOTE — Progress Notes (Signed)
Dwayne Mcgrath is here for a follow-up visit today. Patient reports : Dysuria Some Hematuria None Bowel movements None Urine stream moderate Urgency yes Leakage no Straining no Fatigue no Next appointment with urologist A couple weeks ago.  Vitals:   12/05/19 1437  BP: 137/87  Pulse: 86  Resp: 20  Temp: 98.2 F (36.8 C)  TempSrc: Oral  SpO2: 99%

## 2019-12-05 NOTE — Progress Notes (Signed)
Radiation Oncology         [phone] ________________________________  Name: Dwayne Mcgrath MRN: [id number]  Date: 12/05/2019  DOB: November 19, 1955  Post-Seed Follow-Up Visit Note  CC: Seward Carol, MD  Cleon Gustin, MD  Diagnosis:   64 y.o. gentleman with Stage T1c adenocarcinoma of the prostate with Gleason score of 3+4, and PSA of 5.7.    ICD-10-CM   1. Malignant neoplasm of prostate (HCC)  C61     Interval Since Last Radiation:  3 weeks 11/15/19:  Insertion of radioactive I-125 seeds into the prostate gland; 145 Gy, definitive/boost therapy with placement of SpaceOAR VUE gel.  Narrative:  The patient returns today for routine follow-up.  He is complaining of increased urinary frequency and urinary hesitation symptoms. He filled out a questionnaire regarding urinary function today providing and overall IPSS score of 13 characterizing his symptoms as mil-moderate with increased frequency, urgency and nocturia x3 per night.  Otherwise, he specifically denies dysuria, gross hematuria, weak stream, straining to void or incomplete bladder emptying.   His pre-implant score was 10. He reports a healthy appetite and is maintaining his weight.  He denies abdominal pain, nausea, vomiting, diarrhea or constipation.  Overall, he is quite pleased with his progress to date.  ALLERGIES:  has No Known Allergies.  Meds: Current Outpatient Medications  Medication Sig Dispense Refill  . allopurinol (ZYLOPRIM) 300 MG tablet Take 300 mg by mouth at bedtime.     [name] aspirin EC 81 MG tablet Take 81 mg by mouth daily.    [name] b complex vitamins capsule Take 1 capsule by mouth every other day.    . benazepril (LOTENSIN) 10 MG tablet Take 10 mg by mouth at bedtime.     . Coenzyme Q10 (COQ-10 PO) Take by mouth once a week.    . Ferrous Sulfate (IRON PO) Take by mouth daily.     [name] GARLIC PO Take by mouth once a week.     . glyBURIDE (DIABETA) 5 MG tablet Take 5 mg by mouth every morning.     .  hydrochlorothiazide (HYDRODIURIL) 25 MG tablet Take 25 mg by mouth at bedtime.     . metFORMIN (GLUCOPHAGE) 1000 MG tablet Take 1,000 mg by mouth 2 (two) times daily with a meal.     . Misc Natural Products (TART CHERRY ADVANCED) CAPS Take by mouth once a week.    [name] omeprazole (PRILOSEC) 40 MG capsule Take 40 mg by mouth at bedtime.     . Red Yeast Rice Extract (RED YEAST RICE PO) Take by mouth once a week.    . simvastatin (ZOCOR) 20 MG tablet Take 20 mg by mouth at bedtime.     . traMADol (ULTRAM) 50 MG tablet Take 1 tablet (50 mg total) by mouth every 6 (six) hours as needed. 15 tablet 0  . vitamin C (ASCORBIC ACID) 500 MG tablet Take 500 mg by mouth every other day.     [name] VITAMIN D PO Take by mouth every other day.      No current facility-administered medications for this visit.    Physical Findings: In general this is a well appearing African-American male in no acute distress. He's alert and oriented x4 and appropriate throughout the examination. Cardiopulmonary assessment is negative for acute distress and he exhibits normal effort.   Lab Findings: Lab Results  Component Value Date   WBC 4.5 11/12/2019   HGB 12.7 (L) 11/12/2019   HCT 37.9 (L) 11/12/2019  MCV 89.0 11/12/2019   PLT 225 11/12/2019    Radiographic Findings:  Patient underwent CT imaging in our clinic for post implant dosimetry. The CT will be reviewed by Dr. Tammi Klippel to confirm there is an adequate distribution of radioactive seeds throughout the prostate gland and ensure that there are no seeds in or near the rectum.  We suspect the final radiation plan and dosimetry will show appropriate coverage of the prostate gland. He understands that we will call and inform him of any unexpected findings on further review of his imaging and dosimetry.  Impression/Plan: 64 y.o. gentleman with Stage T1c adenocarcinoma of the prostate with Gleason score of 3+4, and PSA of 5.7. The patient is recovering from the effects of  radiation. His urinary symptoms should gradually improve over the next 4-6 months. We talked about this today. He is encouraged by his improvement already and is otherwise pleased with his outcome. We also talked about long-term follow-up for prostate cancer following seed implant. He understands that ongoing PSA determinations and digital rectal exams will help perform surveillance to rule out disease recurrence. He was seen in the urology office with Jiles Crocker, NP on 11/22/2019 and has a follow up lab appointment scheduled on 02/13/20 and will see Dr. Alyson Ingles in the office the following week. He understands what to expect with his PSA measures.  I am sending a prescription of Flomax to his pharmacy to see if this will help with the increased frequency and urgency that he is experiencing currently.  Patient was also educated today about some of the long-term effects from radiation including a small risk for rectal bleeding and possibly erectile dysfunction. We talked about some of the general management approaches to these potential complications. However, I did encourage the patient to contact our office or return at any point if he has questions or concerns related to his previous radiation and prostate cancer.  Today, a comprehensive survivorship care plan and treatment summary was reviewed with the patient today detailing his prostate cancer diagnosis, treatment course, potential late/long-term effects of treatment, appropriate follow-up care with recommendations for the future, and patient education resources.  A copy of this summary, along with a letter will be sent to the patient's primary care provider via fax after today's visit.   2. Cancer screening:  Due to Mr. Barsch's history and his age, he should receive screening for skin cancers and colon cancer.  The information and recommendations are listed on the patient's comprehensive care plan/treatment summary and were reviewed in detail with the  patient.     3. Health maintenance and wellness promotion: Mr. Cefalu was encouraged to consume 5-7 servings of fruits and vegetables per day. He was provided a copy of the "Nutrition Rainbow" handout, as well as the handout "Take Control of Your Health and [place]" from the [place].  He was also encouraged to engage in moderate to vigorous exercise for 30 minutes per day most days of the week. Information was provided regarding the [facility] fitness program, which is designed for cancer survivors to help them become more physically fit after cancer treatments. We discussed that a healthy BMI is 18.5-24.9 and that maintaining a healthy weight reduces risk of cancer recurrences.  He was instructed to limit his alcohol consumption and continue to abstain from tobacco use.  Lastly, he was encouraged to use sunscreen and wear protective clothing when in the sun.     4. Support services/counseling: It is not  uncommon for this period of the patient's cancer care trajectory to be one of many emotions and stressors.  Mr. Fitzsimmons was encouraged to take advantage of our many support services programs, support groups, and/or counseling in coping with his new life as a cancer survivor after completing anti-cancer treatment.  He was offered support today through active listening and expressive supportive counseling.  He was given information regarding our available services and encouraged to contact me with any questions or for help enrolling in any of our support group/programs.       Nicholos Johns, PA-C

## 2019-12-08 NOTE — Progress Notes (Signed)
°  Radiation Oncology         [phone] ________________________________  Name: Dwayne Mcgrath MRN: [id number]  Date: 12/05/2019  DOB: 09-07-1955  COMPLEX SIMULATION NOTE  NARRATIVE:  The patient was brought to the [facility] today following prostate seed implantation approximately one month ago.  Identity was confirmed.  All relevant records and images related to the planned course of therapy were reviewed.  Then, the patient was set-up supine.  CT images were obtained.  The CT images were loaded into the planning software.  Then the prostate and rectum were contoured.  Treatment planning then occurred.  The implanted iodine 125 seeds were identified by the physics staff for projection of radiation distribution  I have requested : 3D Simulation  I have requested a DVH of the following structures: Prostate and rectum.    ________________________________  Sheral Apley Tammi Klippel, M.D.

## 2019-12-12 ENCOUNTER — Telehealth: Payer: Self-pay | Admitting: Radiation Oncology

## 2019-12-12 NOTE — Telephone Encounter (Signed)
Received message from Romie Jumper that patient request covid 19 booster. Noted patient is already scheduled for booster on 12/17/2019. Phoned patient to inquire. No answer. Left detailed message explaining he is already scheduled for the booster at [facility]. Encouraged patient to phone this RN back if he wanted to switch to [facility] but informed him that it is doubtful anything will be available before 8/24.

## 2019-12-17 ENCOUNTER — Ambulatory Visit: Payer: Federal, State, Local not specified - PPO | Attending: Internal Medicine

## 2019-12-17 DIAGNOSIS — Z23 Encounter for immunization: Secondary | ICD-10-CM

## 2019-12-17 NOTE — Progress Notes (Signed)
   Covid-19 Vaccination Clinic  Name:  Dwayne Mcgrath    MRN: [id number] DOB: February 10, 1956  12/17/2019  [name] was observed post Covid-19 immunization for 15 minutes without incident. He was provided with Vaccine Information Sheet and instruction to access the V-Safe system.   [name] was instructed to call 911 with any severe reactions post vaccine: [name] Difficulty breathing  . Swelling of face and throat  . A fast heartbeat  . A bad rash all over body  . Dizziness and weakness

## 2019-12-25 ENCOUNTER — Encounter: Payer: Self-pay | Admitting: Radiation Oncology

## 2019-12-25 ENCOUNTER — Ambulatory Visit
Admission: RE | Admit: 2019-12-25 | Discharge: 2019-12-25 | Disposition: A | Discharge status: Home / Self Care | Payer: Federal, State, Local not specified - PPO | Source: Ambulatory Visit | Attending: Radiation Oncology | Admitting: Radiation Oncology

## 2019-12-25 DIAGNOSIS — C61 Malignant neoplasm of prostate: Secondary | ICD-10-CM | POA: Insufficient documentation

## 2020-02-04 NOTE — Progress Notes (Signed)
  Radiation Oncology         [phone] ________________________________  Name: KELL FERRIS MRN: [id number]  Date: 12/25/2019  DOB: Jul 09, 1955  3D Planning Note   Prostate Brachytherapy Post-Implant Dosimetry  Diagnosis: 64 y.o. gentleman with Stage T1c adenocarcinoma of the prostate with Gleason score of 3+4, and PSA of 5.7.  Narrative: On a previous date, CLEARANCE CHENAULT returned following prostate seed implantation for post implant planning. He underwent CT scan complex simulation to delineate the three-dimensional structures of the pelvis and demonstrate the radiation distribution.  Since that time, the seed localization, and complex isodose planning with dose volume histograms have now been completed.  Results:   Prostate Coverage - The dose of radiation delivered to the 90% or more of the prostate gland (D90) was 94.33% of the prescription dose. This exceeds our goal of greater than 90%. Rectal Sparing - The volume of rectal tissue receiving the prescription dose or higher was 0.0 cc. This falls under our thresholds tolerance of 1.0 cc.  Impression: The prostate seed implant appears to show adequate target coverage and appropriate rectal sparing.  Plan:  The patient will continue to follow with urology for ongoing PSA determinations. I would anticipate a high likelihood for local tumor control with minimal risk for rectal morbidity.  ________________________________  Sheral Apley Tammi Klippel, M.D.

## 2020-02-06 DIAGNOSIS — Z0000 Encounter for general adult medical examination without abnormal findings: Secondary | ICD-10-CM | POA: Diagnosis not present

## 2020-02-06 DIAGNOSIS — E1169 Type 2 diabetes mellitus with other specified complication: Secondary | ICD-10-CM | POA: Diagnosis not present

## 2020-02-06 DIAGNOSIS — C61 Malignant neoplasm of prostate: Secondary | ICD-10-CM | POA: Diagnosis not present

## 2020-02-06 DIAGNOSIS — M109 Gout, unspecified: Secondary | ICD-10-CM | POA: Diagnosis not present

## 2020-02-06 DIAGNOSIS — R350 Frequency of micturition: Secondary | ICD-10-CM | POA: Diagnosis not present

## 2020-02-06 DIAGNOSIS — E7800 Pure hypercholesterolemia, unspecified: Secondary | ICD-10-CM | POA: Diagnosis not present

## 2020-02-06 DIAGNOSIS — E119 Type 2 diabetes mellitus without complications: Secondary | ICD-10-CM | POA: Diagnosis not present

## 2020-02-13 DIAGNOSIS — Z8546 Personal history of malignant neoplasm of prostate: Secondary | ICD-10-CM | POA: Diagnosis not present

## 2020-02-20 DIAGNOSIS — Z8546 Personal history of malignant neoplasm of prostate: Secondary | ICD-10-CM | POA: Diagnosis not present

## 2021-06-19 IMAGING — DX DG CHEST 2V
2 series · 2 of 2 positions shown · non-contrast
Comparison: None.

CLINICAL DATA: Preoperative evaluation

EXAM:
CHEST - 2 VIEW

[chest pa]
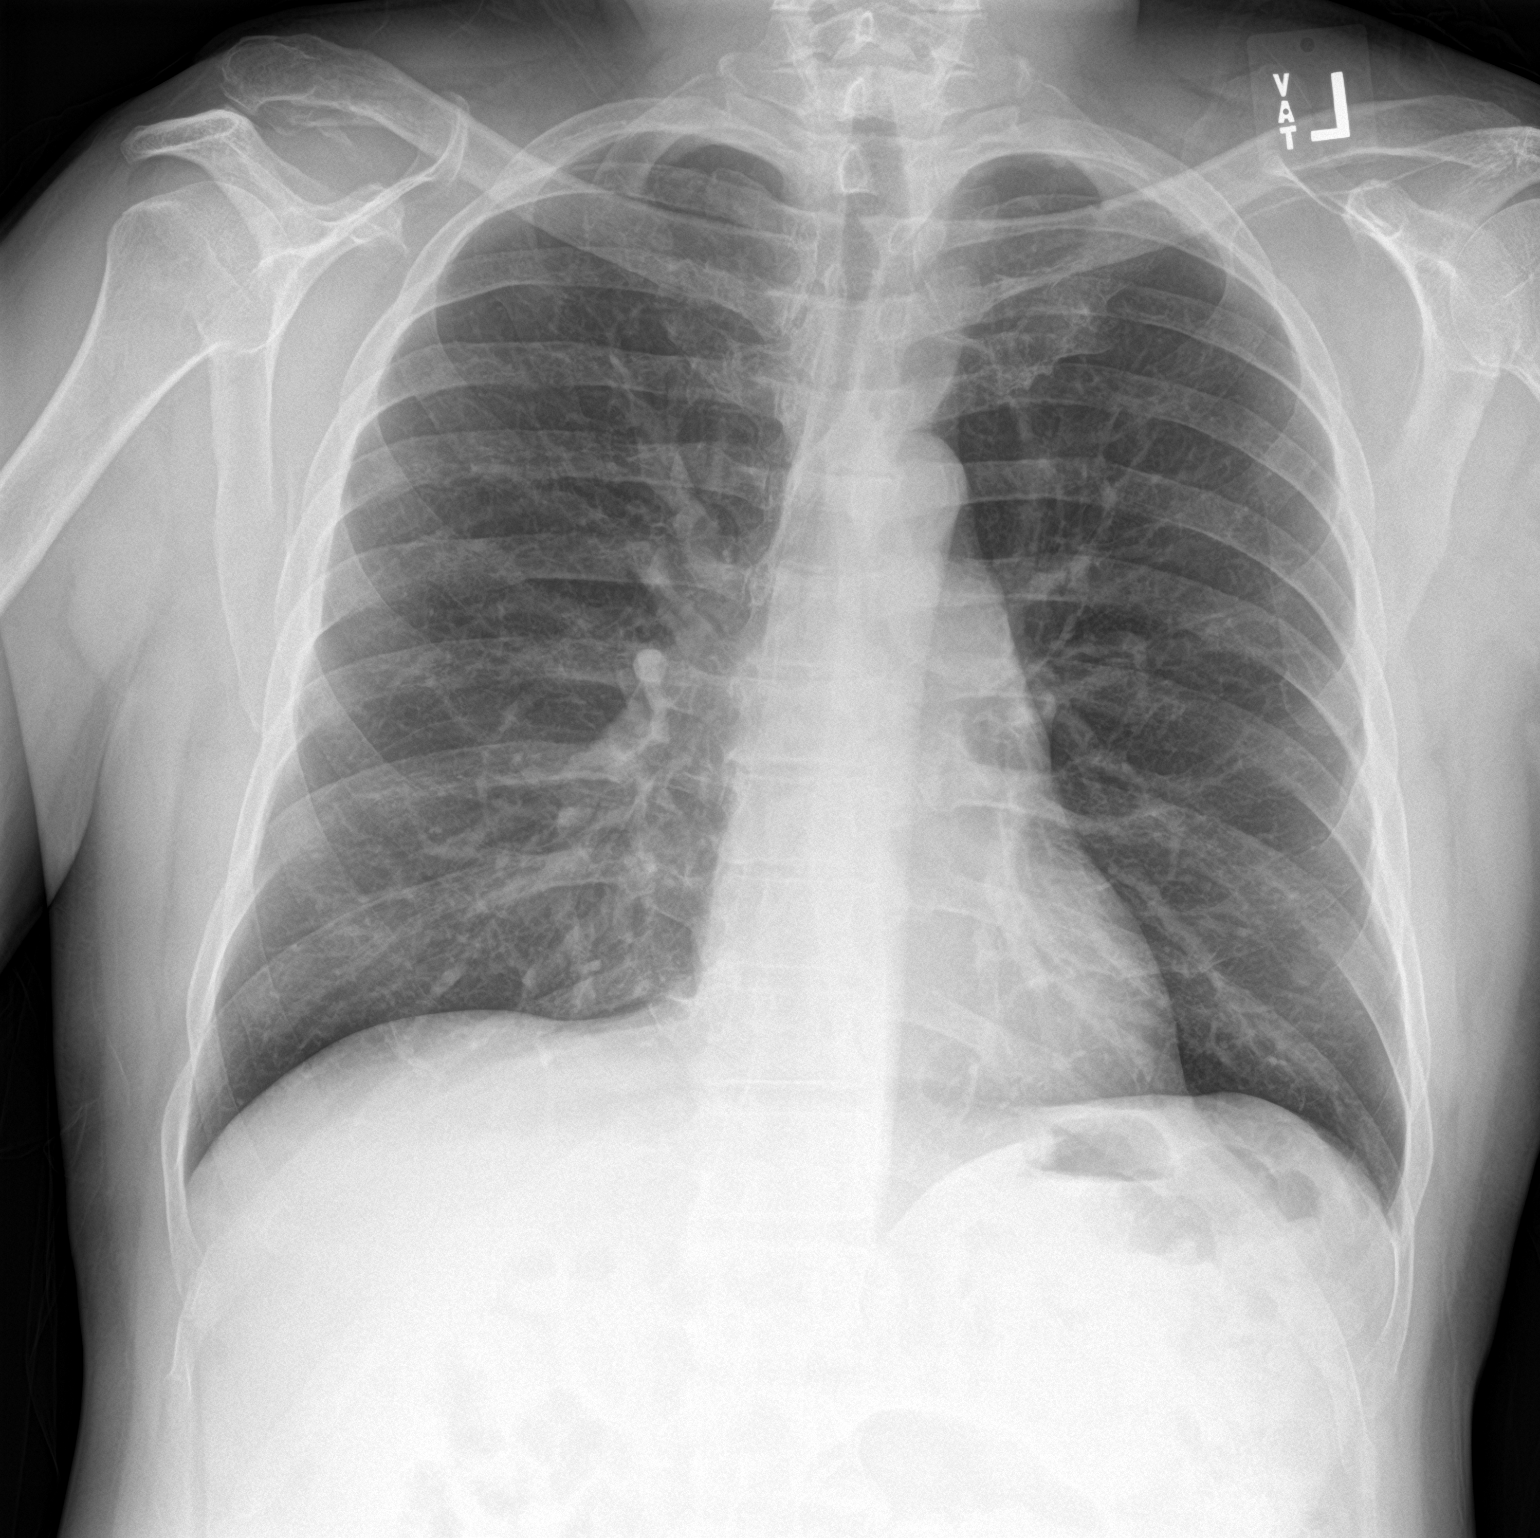

[chest lat]
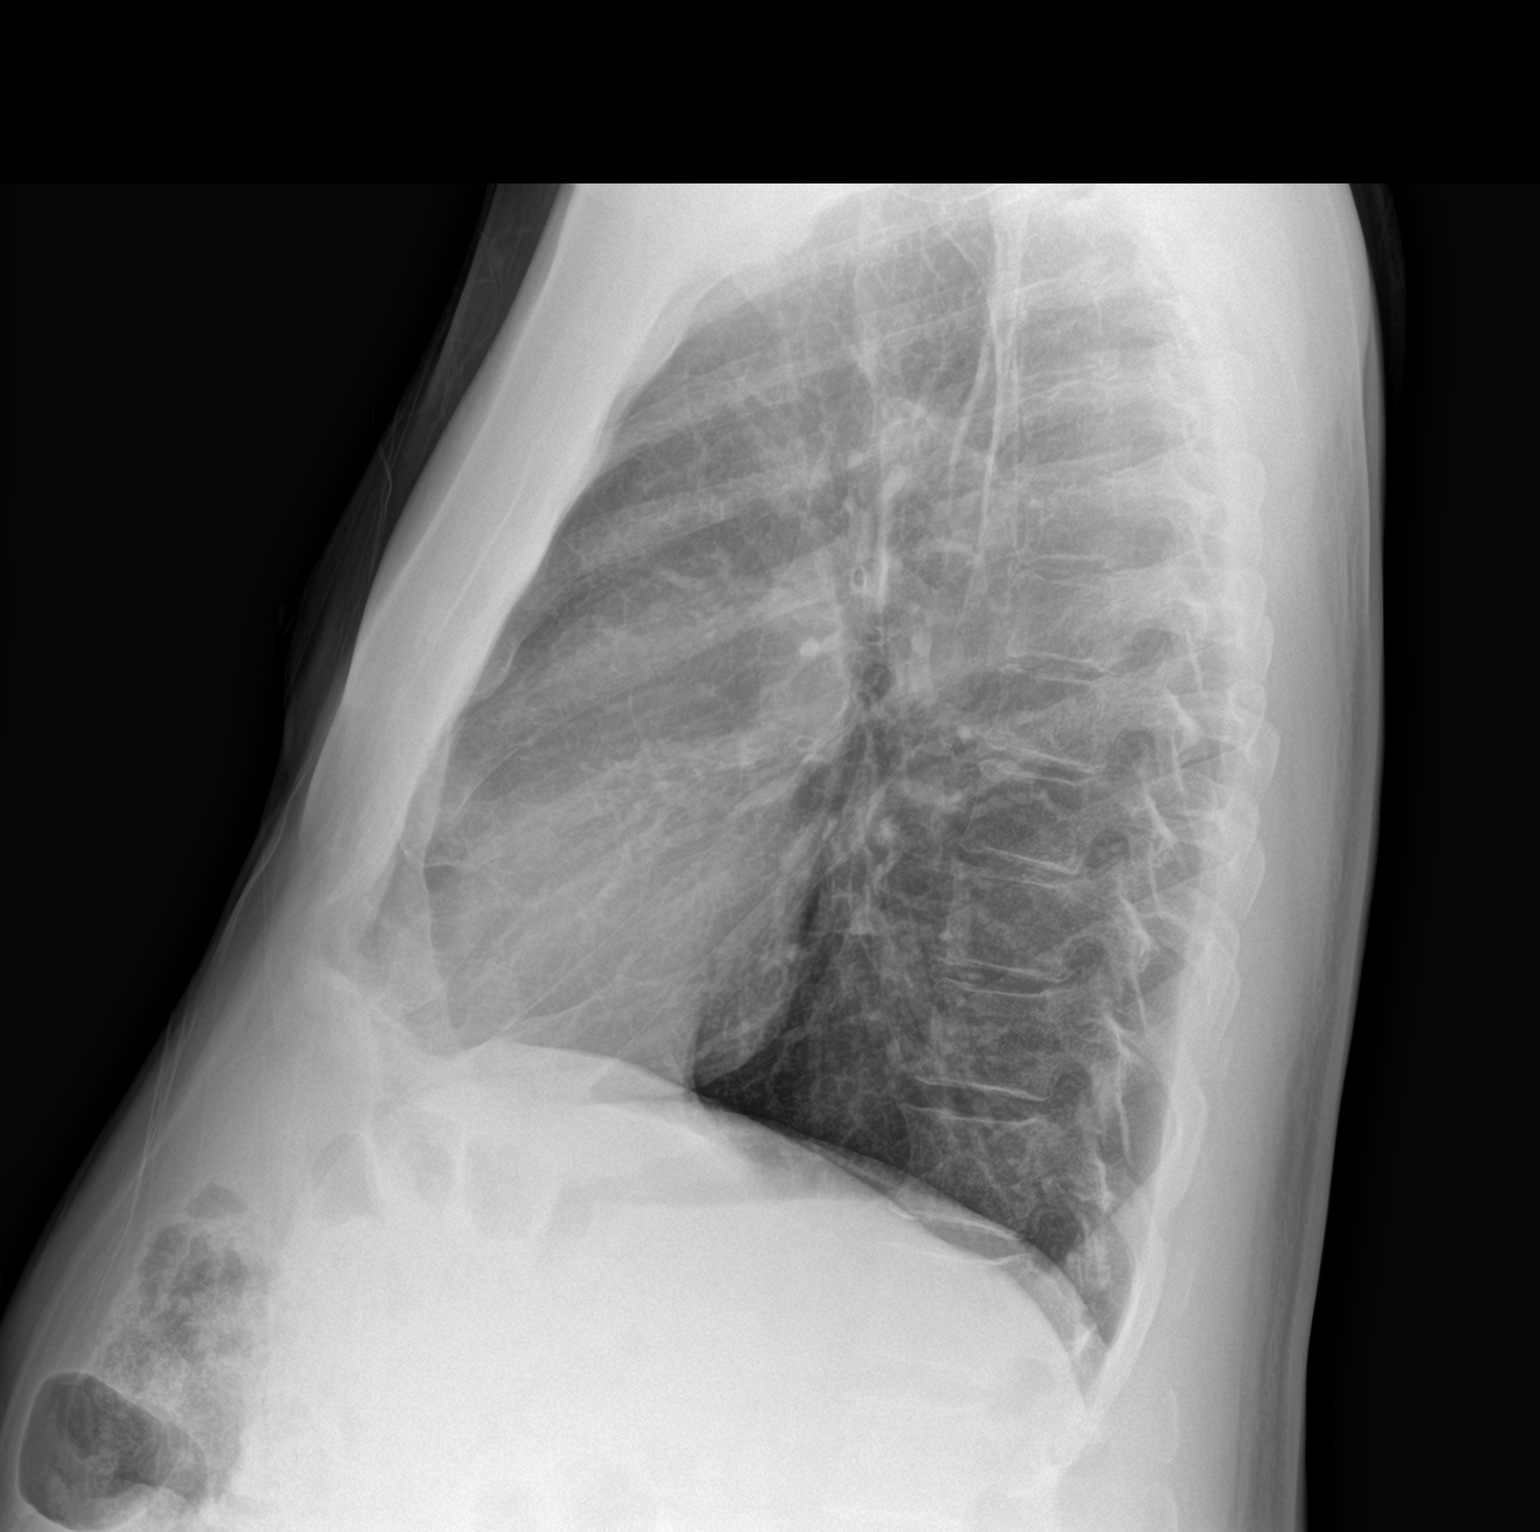

[2 of 2 positions shown; findings below may reference images not displayed]

FINDINGS: The heart size and mediastinal contours are within normal limits.
Both lungs are clear. The visualized skeletal structures are
unremarkable.
IMPRESSION: No active cardiopulmonary disease.
